# Patient Record
Sex: Female | Born: 1980 | Race: Black or African American | Hispanic: No | Marital: Married | Smoking: Never smoker
Health system: Southern US, Community
[De-identification: ages and names within clinical notes are randomized; demographics above are authoritative.]

## PROBLEM LIST (undated history)

## (undated) ENCOUNTER — Inpatient Hospital Stay (HOSPITAL_COMMUNITY): Payer: Self-pay

## (undated) DIAGNOSIS — O24419 Gestational diabetes mellitus in pregnancy, unspecified control: Secondary | ICD-10-CM

## (undated) DIAGNOSIS — Z789 Other specified health status: Secondary | ICD-10-CM

## (undated) DIAGNOSIS — K8020 Calculus of gallbladder without cholecystitis without obstruction: Secondary | ICD-10-CM

## (undated) HISTORY — DX: Gestational diabetes mellitus in pregnancy, unspecified control: O24.419

## (undated) HISTORY — PX: CHOLECYSTECTOMY: SHX55

## (undated) HISTORY — PX: NO PAST SURGERIES: SHX2092

## (undated) HISTORY — DX: Other specified health status: Z78.9

---

## 2009-03-20 ENCOUNTER — Inpatient Hospital Stay (HOSPITAL_COMMUNITY): Admission: AD | Admit: 2009-03-20 | Discharge: 2009-03-20 | Payer: Self-pay | Admitting: Obstetrics and Gynecology

## 2009-04-03 ENCOUNTER — Inpatient Hospital Stay (HOSPITAL_COMMUNITY): Admission: AD | Admit: 2009-04-03 | Discharge: 2009-04-05 | Payer: Self-pay | Admitting: Obstetrics and Gynecology

## 2009-06-19 ENCOUNTER — Encounter: Admission: RE | Admit: 2009-06-19 | Discharge: 2009-06-19 | Payer: Self-pay | Admitting: Internal Medicine

## 2009-08-23 ENCOUNTER — Emergency Department (HOSPITAL_BASED_OUTPATIENT_CLINIC_OR_DEPARTMENT_OTHER): Admission: EM | Admit: 2009-08-23 | Discharge: 2009-08-24 | Payer: Self-pay | Admitting: Emergency Medicine

## 2010-07-16 ENCOUNTER — Ambulatory Visit: Payer: Self-pay | Admitting: Interventional Radiology

## 2010-07-16 ENCOUNTER — Ambulatory Visit (HOSPITAL_BASED_OUTPATIENT_CLINIC_OR_DEPARTMENT_OTHER): Admission: RE | Admit: 2010-07-16 | Discharge: 2010-07-16 | Payer: Self-pay | Admitting: Obstetrics and Gynecology

## 2010-12-08 ENCOUNTER — Encounter: Payer: Self-pay | Admitting: Internal Medicine

## 2011-02-19 LAB — URINALYSIS, ROUTINE W REFLEX MICROSCOPIC
Bilirubin Urine: NEGATIVE
Glucose, UA: NEGATIVE mg/dL
Ketones, ur: 15 mg/dL — AB
pH: 6.5 (ref 5.0–8.0)

## 2011-02-19 LAB — DIFFERENTIAL
Basophils Absolute: 0.1 10*3/uL (ref 0.0–0.1)
Basophils Relative: 1 % (ref 0–1)
Eosinophils Absolute: 0 10*3/uL (ref 0.0–0.7)
Eosinophils Relative: 0 % (ref 0–5)
Lymphocytes Relative: 23 % (ref 12–46)
Lymphs Abs: 2.4 10*3/uL (ref 0.7–4.0)
Neutrophils Relative %: 73 % (ref 43–77)

## 2011-02-19 LAB — COMPREHENSIVE METABOLIC PANEL
ALT: 56 U/L — ABNORMAL HIGH (ref 0–35)
AST: 119 U/L — ABNORMAL HIGH (ref 0–37)
Albumin: 4 g/dL (ref 3.5–5.2)
Calcium: 8.9 mg/dL (ref 8.4–10.5)
Chloride: 105 mEq/L (ref 96–112)
Creatinine, Ser: 0.8 mg/dL (ref 0.4–1.2)
GFR calc Af Amer: >60 mL/min (ref >60)
GFR calc non Af Amer: >60 mL/min (ref >60)
Glucose, Bld: 138 mg/dL — ABNORMAL HIGH (ref 70–99)

## 2011-02-19 LAB — CBC
HCT: 34.9 % — ABNORMAL LOW (ref 36.0–46.0)
RBC: 4.41 MIL/uL (ref 3.87–5.11)
RDW: 13.1 % (ref 11.5–15.5)

## 2011-02-19 LAB — LIPASE, BLOOD: Lipase: 194 U/L (ref 23–300)

## 2011-02-24 LAB — CBC
HCT: 29.2 % — ABNORMAL LOW (ref 36.0–46.0)
Hemoglobin: 12.1 g/dL (ref 12.0–15.0)
MCHC: 33.6 g/dL (ref 30.0–36.0)
MCV: 77 fL — ABNORMAL LOW (ref 78.0–100.0)
Platelets: 230 10*3/uL (ref 150–400)
RBC: 4.68 MIL/uL (ref 3.87–5.11)
RDW: 17.1 % — ABNORMAL HIGH (ref 11.5–15.5)
RDW: 17.2 % — ABNORMAL HIGH (ref 11.5–15.5)
WBC: 12.3 10*3/uL — ABNORMAL HIGH (ref 4.0–10.5)
WBC: 9.5 10*3/uL (ref 4.0–10.5)

## 2011-07-17 ENCOUNTER — Other Ambulatory Visit (HOSPITAL_COMMUNITY)
Admission: RE | Admit: 2011-07-17 | Discharge: 2011-07-17 | Disposition: A | Discharge status: Home / Self Care | Payer: No Typology Code available for payment source | Source: Ambulatory Visit | Attending: Obstetrics and Gynecology | Admitting: Obstetrics and Gynecology

## 2011-07-17 DIAGNOSIS — Z1159 Encounter for screening for other viral diseases: Secondary | ICD-10-CM | POA: Insufficient documentation

## 2011-07-17 DIAGNOSIS — Z01419 Encounter for gynecological examination (general) (routine) without abnormal findings: Secondary | ICD-10-CM | POA: Insufficient documentation

## 2011-07-28 ENCOUNTER — Other Ambulatory Visit: Payer: Self-pay | Admitting: Otolaryngology

## 2011-07-28 ENCOUNTER — Other Ambulatory Visit (HOSPITAL_COMMUNITY)
Admission: RE | Admit: 2011-07-28 | Discharge: 2011-07-28 | Disposition: A | Discharge status: Home / Self Care | Payer: No Typology Code available for payment source | Source: Ambulatory Visit | Attending: Otolaryngology | Admitting: Otolaryngology

## 2011-07-28 DIAGNOSIS — E049 Nontoxic goiter, unspecified: Secondary | ICD-10-CM | POA: Insufficient documentation

## 2011-11-17 HISTORY — PX: WISDOM TOOTH EXTRACTION: SHX21

## 2012-02-27 ENCOUNTER — Emergency Department (HOSPITAL_COMMUNITY): Payer: No Typology Code available for payment source

## 2012-02-27 ENCOUNTER — Emergency Department (HOSPITAL_COMMUNITY)
Admission: EM | Admit: 2012-02-27 | Discharge: 2012-02-27 | Disposition: A | Discharge status: Home / Self Care | Payer: No Typology Code available for payment source | Attending: Emergency Medicine | Admitting: Emergency Medicine

## 2012-02-27 ENCOUNTER — Encounter (HOSPITAL_COMMUNITY): Payer: Self-pay | Admitting: *Deleted

## 2012-02-27 ENCOUNTER — Other Ambulatory Visit: Payer: Self-pay

## 2012-02-27 DIAGNOSIS — R10816 Epigastric abdominal tenderness: Secondary | ICD-10-CM | POA: Insufficient documentation

## 2012-02-27 DIAGNOSIS — R10811 Right upper quadrant abdominal tenderness: Secondary | ICD-10-CM | POA: Insufficient documentation

## 2012-02-27 DIAGNOSIS — K8020 Calculus of gallbladder without cholecystitis without obstruction: Secondary | ICD-10-CM

## 2012-02-27 DIAGNOSIS — R1013 Epigastric pain: Secondary | ICD-10-CM | POA: Insufficient documentation

## 2012-02-27 LAB — URINALYSIS, ROUTINE W REFLEX MICROSCOPIC
Leukocytes, UA: NEGATIVE
Nitrite: NEGATIVE
Protein, ur: NEGATIVE mg/dL

## 2012-02-27 LAB — HEPATIC FUNCTION PANEL
ALT: 14 U/L (ref 0–35)
AST: 27 U/L (ref 0–37)
Albumin: 3.9 g/dL (ref 3.5–5.2)
Alkaline Phosphatase: 49 U/L (ref 39–117)
Bilirubin, Direct: <0.1 mg/dL (ref 0.0–0.3)
Total Bilirubin: 0.2 mg/dL — ABNORMAL LOW (ref 0.3–1.2)
Total Protein: 7.5 g/dL (ref 6.0–8.3)

## 2012-02-27 LAB — LIPASE, BLOOD: Lipase: 41 U/L (ref 11–59)

## 2012-02-27 LAB — POCT PREGNANCY, URINE: Preg Test, Ur: NEGATIVE

## 2012-02-27 LAB — CBC
HCT: 37.1 % (ref 36.0–46.0)
MCH: 26.2 pg (ref 26.0–34.0)
MCV: 78.9 fL (ref 78.0–100.0)
WBC: 6.4 10*3/uL (ref 4.0–10.5)

## 2012-02-27 LAB — CARDIAC PANEL(CRET KIN+CKTOT+MB+TROPI)
Relative Index: INVALID (ref 0.0–2.5)
Troponin I: <0.3 ng/mL (ref <0.30)

## 2012-02-27 LAB — BASIC METABOLIC PANEL
Glucose, Bld: 104 mg/dL — ABNORMAL HIGH (ref 70–99)
Potassium: 4 mEq/L (ref 3.5–5.1)

## 2012-02-27 MED ORDER — POLYETHYLENE GLYCOL 3350 17 G PO PACK
17.0000 g | PACK | ORAL | Status: AC
  Administered 2012-02-27: 17 g via ORAL
  Filled 2012-02-27: qty 1

## 2012-02-27 MED ORDER — HYDROCODONE-ACETAMINOPHEN 5-325 MG PO TABS: 1.0000 | ORAL_TABLET | Freq: Four times a day (QID) | ORAL | Status: AC | PRN

## 2012-02-27 MED ORDER — POLYETHYLENE GLYCOL 3350 17 G PO PACK: 17.0000 g | PACK | Freq: Every day | ORAL | Status: AC

## 2012-02-27 MED ORDER — OXYCODONE-ACETAMINOPHEN 5-325 MG PO TABS
1.0000 | ORAL_TABLET | Freq: Once | ORAL | Status: AC
  Administered 2012-02-27: 1 via ORAL
  Filled 2012-02-27: qty 1

## 2012-02-27 NOTE — ED Notes (Signed)
Patient transported to CT 

## 2012-02-27 NOTE — Discharge Instructions (Signed)
Follow up with the general surgeon listed above for more studies on your gallbladder.  Reconstructions below to learn more about her diagnosis and reasons to return to the emergency department.  Use medications as prescribed.  Only use pain medication for severe pain and do not operate any machinery within 4 hours taking this medication.  Cholelithiasis Cholelithiasis (also called gallstones) is a form of gallbladder disease where gallstones form in your gallbladder. The gallbladder is a non-essential organ that stores bile made in the liver, which helps digest fats. Gallstones begin as small crystals and slowly grow into stones. Gallstone pain occurs when the gallbladder spasms, and a gallstone is blocking the duct. Pain can also occur when a stone passes out of the duct.  Women are more likely to develop gallstones than men. Other factors that increase the risk of gallbladder disease are:  Having multiple pregnancies. Physicians sometimes advise removing diseased gallbladders before future pregnancies.   Obesity.   Diets heavy in fried foods and fat.   Increasing age (older than 18).   Prolonged use of medications containing female hormones.   Diabetes mellitus.   Rapid weight loss.   Family history of gallstones (heredity).  SYMPTOMS  Feeling sick to your stomach (nauseous).   Abdominal pain.   Yellowing of the skin (jaundice).   Sudden pain. It may persist from several minutes to several hours.   Worsening pain with deep breathing or when jarred.   Fever.   Tenderness to the touch.  In some cases, when gallstones do not move into the bile duct, people have no pain or symptoms. These are called "silent" gallstones. TREATMENT In severe cases, emergency surgery may be required. HOME CARE INSTRUCTIONS   Only take over-the-counter or prescription medicines for pain, discomfort, or fever as directed by your caregiver.   Follow a low-fat diet until seen again. Fat causes the  gallbladder to contract, which can result in pain.   Follow up as instructed. Attacks are almost always recurrent and surgery is usually required for permanent treatment.  SEEK IMMEDIATE MEDICAL CARE IF:   Your pain increases and is not controlled by medications.   You have an oral temperature above 102 F (38.9 C), not controlled by medication.   You develop nausea and vomiting.  MAKE SURE YOU:   Understand these instructions.   Will watch your condition.   Will get help right away if you are not doing well or get worse.  Document Released: 10/29/2005 Document Revised: 10/22/2011 Document Reviewed: 01/01/2011 [facility] Patient Information 2012 [place], [place].Constipation in Adults Constipation is having fewer than 2 bowel movements per week. Usually, the stools are hard. As we grow older, constipation is more common. If you try to fix constipation with laxatives, the problem may get worse. This is because laxatives taken over a long period of time make the colon muscles weaker. A low-fiber diet, not taking in enough fluids, and taking some medicines may make these problems worse. MEDICATIONS THAT MAY CAUSE CONSTIPATION  Water pills (diuretics).   Calcium channel blockers (used to control blood pressure and for the heart).   Certain pain medicines (narcotics).   Anticholinergics.   Anti-inflammatory agents.   Antacids that contain aluminum.  DISEASES THAT CONTRIBUTE TO CONSTIPATION  Diabetes.   Parkinson's disease.   Dementia.   Stroke.   Depression.   Illnesses that cause problems with salt and water metabolism.  HOME CARE INSTRUCTIONS   Constipation is usually best cared for without medicines. Increasing dietary fiber and  eating more fruits and vegetables is the best way to manage constipation.   Slowly increase fiber intake to 25 to 38 grams per day. Whole grains, fruits, vegetables, and legumes are good sources of fiber. A dietitian can further help you  incorporate high-fiber foods into your diet.   Drink enough water and fluids to keep your urine clear or pale yellow.   A fiber supplement may be added to your diet if you cannot get enough fiber from foods.   Increasing your activities also helps improve regularity.   Suppositories, as suggested by your caregiver, will also help. If you are using antacids, such as aluminum or calcium containing products, it will be helpful to switch to products containing magnesium if your caregiver says it is okay.   If you have been given a liquid injection (enema) today, this is only a temporary measure. It should not be relied on for treatment of longstanding (chronic) constipation.   Stronger measures, such as magnesium sulfate, should be avoided if possible. This may cause uncontrollable diarrhea. Using magnesium sulfate may not allow you time to make it to the bathroom.  SEEK IMMEDIATE MEDICAL CARE IF:   There is bright red blood in the stool.   The constipation stays for more than 4 days.   There is belly (abdominal) or rectal pain.   You do not seem to be getting better.   You have any questions or concerns.  MAKE SURE YOU:   Understand these instructions.   Will watch your condition.   Will get help right away if you are not doing well or get worse.  Document Released: 07/31/2004 Document Revised: 10/22/2011 Document Reviewed: 10/06/2011 [facility] Patient Information 2012 [place], [place].

## 2012-02-27 NOTE — ED Provider Notes (Signed)
History     CSN: [id number]  Arrival date & time 02/27/12  8295   First MD Initiated Contact with Patient 02/27/12 [phone]      Chief Complaint  Patient presents with  . Abdominal Pain    epigastric pain with pain in back and left shoulder    (Consider location/radiation/quality/duration/timing/severity/associated sxs/prior treatment) HPI Comments: Patient with no significant past medical history presents emergency Department with a chief complaint of abdominal pain.  It is located in the epigastric right upper quadrant, onset began at 2 a.m. this morning, was acute in nature and has gradually worsened.  Pain is described as a pressure or and patient states she is unable to burp.  The pain radiates across the diaphragm and to her right shoulder.  Patients last menstrual period was March 24, she has no history of abdominal surgery.  In addition patient states that she has been constipated greater than one week.  She took an enema 3 days ago which helped her or have a bowel movement yesterday and again this morning however both were extremely strange.  Movements make the abdominal pain worse.  Patient denies nausea, vomiting, fevers, night sweats, chills, chest pain, shortness of breath.  The patient is unaware if food makes the symptoms worse.  No other complaints at this time.  Patient is a 31 y.o. female presenting with abdominal pain. The history is provided by the patient.  Abdominal Pain The primary symptoms of the illness include abdominal pain. The primary symptoms of the illness do not include fever, shortness of breath, nausea, vomiting, diarrhea or dysuria.  Symptoms associated with the illness do not include chills, constipation, urgency or hematuria.    History reviewed. No pertinent past medical history.  History reviewed. No pertinent past surgical history.  No family history on file.  History  Substance Use Topics  . Smoking status: Not on file  . Smokeless tobacco: Not on  file  . Alcohol Use: No    OB History    Grav Para Term Preterm Abortions TAB SAB Ect Mult Living                  Review of Systems  Constitutional: Positive for appetite change. Negative for fever and chills.  HENT: Negative for neck stiffness and dental problem.   Eyes: Negative for visual disturbance.  Respiratory: Negative for cough, chest tightness, shortness of breath and wheezing.   Cardiovascular: Negative for chest pain.  Gastrointestinal: Positive for abdominal pain. Negative for nausea, vomiting, diarrhea, constipation, blood in stool, abdominal distention, anal bleeding and rectal pain.  Genitourinary: Negative for dysuria, urgency, hematuria and flank pain.  Musculoskeletal: Negative for myalgias and arthralgias.  Skin: Negative for rash.  Neurological: Negative for dizziness, syncope, speech difficulty, numbness and headaches.  Hematological: Does not bruise/bleed easily.  All other systems reviewed and are negative.    Allergies  Review of patient's allergies indicates no known allergies.  Home Medications   Current Outpatient Rx  Name Route Sig Dispense Refill  . IBUPROFEN 200 MG PO TABS Oral Take 400 mg by mouth every 6 (six) hours as needed. For pain      BP 113/64  Temp(Src) 98.3 F (36.8 C) (Oral)  Resp 20  SpO2 100%  LMP 02/07/2012  Physical Exam  Nursing note and vitals reviewed. Constitutional: Vital signs are normal. She appears well-developed and well-nourished. No distress.  HENT:  Head: Normocephalic and atraumatic.  Mouth/Throat: Uvula is midline, oropharynx is clear and moist and  mucous membranes are normal.  Eyes: Conjunctivae and EOM are normal. Pupils are equal, round, and reactive to light.  Neck: Normal range of motion and full passive range of motion without pain. Neck supple. No spinous process tenderness and no muscular tenderness present. No rigidity. No Brudzinski's sign noted.  Cardiovascular: Normal rate and regular rhythm.    Pulmonary/Chest: Effort normal and breath sounds normal. No accessory muscle usage. Not tachypneic. No respiratory distress.  Abdominal: Soft. Normal appearance. She exhibits no distension, no ascites, no pulsatile midline mass and no mass. There is tenderness in the right upper quadrant and epigastric area. There is no CVA tenderness. No hernia.    Lymphadenopathy:    She has no cervical adenopathy.  Neurological: She is alert.  Skin: Skin is warm and dry. No rash noted. She is not diaphoretic.  Psychiatric: She has a normal mood and affect. Her speech is normal and behavior is normal.    ED Course  Procedures (including critical care time)  Labs Reviewed  BASIC METABOLIC PANEL - Abnormal; Notable for the following:    Glucose, Bld 104 (*)    All other components within normal limits  HEPATIC FUNCTION PANEL - Abnormal; Notable for the following:    Total Bilirubin 0.2 (*)    All other components within normal limits  CARDIAC PANEL(CRET KIN+CKTOT+MB+TROPI)  CBC  URINALYSIS, ROUTINE W REFLEX MICROSCOPIC  LIPASE, BLOOD  POCT PREGNANCY, URINE   Dg Abd 1 View  02/27/2012  *RADIOLOGY REPORT*  Clinical Data: Constipation and abdominal pain.  ABDOMEN - 1 VIEW  Comparison: None.  Findings: Single view of the abdomen was obtained.  There is a large amount of stool throughout the abdomen.  There is gas within the colon.  No significant small bowel gaseous distention.  Limited evaluation for renal calculi due to the large stool burden.  No gross bony abnormality.  IMPRESSION: Large amount of stool throughout the abdomen.  Findings are compatible with history of constipation.  Original Report Authenticated By: Richarda Overlie, M.D.   US Abdomen Complete  02/27/2012  *RADIOLOGY REPORT*  Clinical Data:  Abdominal pain.  COMPLETE ABDOMINAL ULTRASOUND  Comparison:  None.  Findings:  Gallbladder:  Numerous tiny layering gallstones are observed, causing shadowing.  Gallbladder wall thickness is within  normal limits.  No pericholecystic fluid.  Sonographic Murphy's sign is absent.  Common bile duct:  Dilated, measuring 7 mm in diameter, without directly visualized choledocholithiasis.  Liver:  Mild hepatic steatosis.  No intrahepatic biliary dilatation is observed.  No focal lesion noted.  IVC:  Appears normal.  Pancreas:  Not well seen due to overlying bowel gas.  Spleen:  Measures 10 cm craniocaudad, at the upper limits of normal in size.  Right Kidney:  Measures 10.4 cm in length and appears normal.  Left Kidney:  Measures 0.5 cm in length and appears normal.  Abdominal aorta:  No aneurysm identified.  IMPRESSION:  1.  Cholelithiasis, with numerous tiny gallstones observed. Sonographic Murphy's sign absent. 2.  Dilated common bile duct.  However, there was no directly demonstrated choledocholithiasis.  No intrahepatic bile duct dilatation. 3.  Mild fatty infiltration of the liver 4.  Poor visualization of pancreas due to overlying bowel gas.  Original Report Authenticated By: Dellia Cloud, M.D.     No diagnosis found.   Date: 02/27/2012  Rate: 79  Rhythm: normal sinus rhythm  QRS Axis: normal  Intervals: normal  ST/T Wave abnormalities: normal  Conduction Disutrbances: none  Narrative Interpretation:  Old EKG Reviewed: No old ECG    MDM  Cholelithiasis & constipation  Labs and radiology reviewed. Pt w VSS, no white count, and currently asymptomatic. Pt advised to follow up with general surgery for further studies. Pain managed in the ED and pt given miralax. No complaints at this time and VSS prior to dc. Pt advised to avoid fatty foods and to add fiber to diet. Verbalizes understanding.         Jaci Carrel, PA-C 02/27/12 0834  Jaci Carrel, PA-C 02/27/12 [phone]

## 2012-02-27 NOTE — ED Provider Notes (Signed)
Medical screening examination/treatment/procedure(s) were performed by non-physician practitioner and as supervising physician I was immediately available for consultation/collaboration.  Olivia Mackie, MD 02/27/12 2136

## 2012-02-27 NOTE — ED Notes (Signed)
Pt woke up with epigastric pain radiating to her left shoulder and her back.  Pt has some sob with this.  No nausea or diaphoresis.

## 2012-03-23 ENCOUNTER — Ambulatory Visit (INDEPENDENT_AMBULATORY_CARE_PROVIDER_SITE_OTHER): Payer: No Typology Code available for payment source | Admitting: Surgery

## 2012-03-23 ENCOUNTER — Encounter (INDEPENDENT_AMBULATORY_CARE_PROVIDER_SITE_OTHER): Payer: Self-pay | Admitting: Surgery

## 2012-03-23 VITALS — BP 104/60 | HR 85 | Temp 98.6°F | Ht 66.0 in | Wt 149.0 lb

## 2012-03-23 DIAGNOSIS — K8020 Calculus of gallbladder without cholecystitis without obstruction: Secondary | ICD-10-CM

## 2012-03-23 DIAGNOSIS — K5900 Constipation, unspecified: Secondary | ICD-10-CM | POA: Insufficient documentation

## 2012-03-23 NOTE — Patient Instructions (Signed)
See the Handout(s) we gave you.  Consider surgery.  Please call our office at [phone] if you wish to schedule surgery or if you have further questions / concerns.    GETTING TO GOOD BOWEL HEALTH. Irregular bowel habits such as constipation and diarrhea can lead to many problems over time.  Having one soft bowel movement a day is the most important way to prevent further problems.  The anorectal canal is designed to handle stretching and feces to safely manage our ability to get rid of solid waste (feces, poop, stool) out of our body.  BUT, hard constipated stools can act like ripping concrete bricks and diarrhea can be a burning fire to this very sensitive area of our body, causing inflamed hemorrhoids, anal fissures, increasing risk is perirectal abscesses, abdominal pain/bloating, an making irritable bowel worse.     The goal: ONE SOFT BOWEL MOVEMENT A DAY!  To have soft, regular bowel movements:    Drink at least 8 tall glasses of water a day.     Take plenty of fiber.  Fiber is the undigested part of plant food that passes into the colon, acting s "natures broom" to encourage bowel motility and movement.  Fiber can absorb and hold large amounts of water. This results in a larger, bulkier stool, which is soft and easier to pass. Work gradually over several weeks up to 6 servings a day of fiber (25g a day even more if needed) in the form of: o Vegetables -- Root (potatoes, carrots, turnips), leafy green (lettuce, salad greens, celery, spinach), or cooked high residue (cabbage, broccoli, etc) o Fruit -- Fresh (unpeeled skin & pulp), Dried (prunes, apricots, cherries, etc ),  or stewed ( applesauce)  o Whole grain breads, pasta, etc (whole wheat)  o Bran cereals    Bulking Agents -- This type of water-retaining fiber generally is easily obtained each day by one of the following:  o Psyllium bran -- The psyllium plant is remarkable because its ground seeds can retain so much water. This product  is available as Metamucil, Konsyl, Effersyllium, Per Diem Fiber, or the less expensive generic preparation in drug and health food stores. Although labeled a laxative, it really is not a laxative.  o Methylcellulose -- This is another fiber derived from wood which also retains water. It is available as Citrucel. o Polyethylene Glycol - and "artificial" fiber commonly called Miralax or Glycolax.  It is helpful for people with gassy or bloated feelings with regular fiber o Flax Seed - a less gassy fiber than psyllium   No reading or other relaxing activity while on the toilet. If bowel movements take longer than 5 minutes, you are too constipated   AVOID CONSTIPATION.  High fiber and water intake usually takes care of this.  Sometimes a laxative is needed to stimulate more frequent bowel movements, but    Laxatives are not a good long-term solution as it can wear the colon out. o Osmotics (Milk of Magnesia, Fleets phosphosoda, Magnesium citrate, MiraLax, GoLytely) are safer than  o Stimulants (Senokot, Castor Oil, Dulcolax, Ex Lax)    o Do not take laxatives for more than 7days in a row.    IF SEVERELY CONSTIPATED, try a Bowel Retraining Program: o Do not use laxatives.  o Eat a diet high in roughage, such as bran cereals and leafy vegetables.  o Drink six (6) ounces of prune or apricot juice each morning.  o Eat two (2) large servings of stewed fruit  each day.  o Take one (1) heaping tablespoon of a psyllium-based bulking agent twice a day. Use sugar-free sweetener when possible to avoid excessive calories.  o Eat a normal breakfast.  o Set aside 15 minutes after breakfast to sit on the toilet, but do not strain to have a bowel movement.  o If you do not have a bowel movement by the third day, use an enema and repeat the above steps.    Controlling diarrhea o Switch to liquids and simpler foods for a few days to avoid stressing your intestines further. o Avoid dairy products (especially milk &  ice cream) for a short time.  The intestines often can lose the ability to digest lactose when stressed. o Avoid foods that cause gassiness or bloating.  Typical foods include beans and other legumes, cabbage, broccoli, and dairy foods.  Every person has some sensitivity to other foods, so listen to our body and avoid those foods that trigger problems for you. o Adding fiber (Citrucel, Metamucil, psyllium, Miralax) gradually can help thicken stools by absorbing excess fluid and retrain the intestines to act more normally.  Slowly increase the dose over a few weeks.  Too much fiber too soon can backfire and cause cramping & bloating. o Probiotics (such as active yogurt, Align, etc) may help repopulate the intestines and colon with normal bacteria and calm down a sensitive digestive tract.  Most studies show it to be of mild help, though, and such products can be costly. o Medicines:   Bismuth subsalicylate (ex. Kayopectate, Pepto Bismol) every 30 minutes for up to 6 doses can help control diarrhea.  Avoid if pregnant.   Loperamide (Immodium) can slow down diarrhea.  Start with two tablets (4mg  total) first and then try one tablet every 6 hours.  Avoid if you are having fevers or severe pain.  If you are not better or start feeling worse, stop all medicines and call your doctor for advice o Call your doctor if you are getting worse or not better.  Sometimes further testing (cultures, endoscopy, X-ray studies, bloodwork, etc) may be needed to help diagnose and treat the cause of the diarrhea. o

## 2012-03-23 NOTE — Progress Notes (Signed)
Subjective:     Patient ID: Suzanne Ramirez, female   DOB: Mar 20, 1981, 31 y.o.   MRN: [id number]  HPI  Suzanne Ramirez  07/19/1981 [id number]  Patient Care Team: Ron Parker, MD as PCP - General (Internal Medicine)  This patient is a 31 y.o.female who presents today for surgical evaluation at the request of Lisette Paz & Marisa Severin, [facility] ED.    Reason for evaluation: Abdominal pain and gallstones.  Patient is a pleasant young female from [place]. Pleas Koch are often. Has had episodes of epigastric pain. Radiating to her back and shoulders. Positive nausea and bloating. Attacks have been usually after a fatty meal. She had a more severe attack and went to the emergency room. Evaluation was concerning for biliary colic with gallstones. Differential diagnosis seemed less likely. She sent to me for surgical evaluation.  Patient notes she has some mild heartburn but usually controlled with diet. Does not take medicines. No prior bowel surgery. Can walk 20 minutes before she starts feeling a little short of breath and tired. No exertional chest pain. Nonsmoker. Has bowel movements one to 2 times a week. Primary laxative a few times but it made her feel crampy. She comes today with her husband.  Patient Active Problem List  Diagnoses  . Cholecystolithiasis  . Constipation    No past medical history on file.  No past surgical history on file.  History   Social History  . Marital Status: Married    Spouse Name: N/A    Number of Children: N/A  . Years of Education: N/A   Occupational History  . Not on file.   Social History Main Topics  . Smoking status: Never Smoker   . Smokeless tobacco: Not on file  . Alcohol Use: No  . Drug Use: No  . Sexually Active:    Other Topics Concern  . Not on file   Social History Narrative  . No narrative on file    No family history on file.  Current Outpatient Prescriptions  Medication Sig Dispense Refill  . oxyCODONE-acetaminophen  (TYLOX) 5-500 MG per capsule Take 1 capsule by mouth every 4 (four) hours as needed.         No Known Allergies  BP 104/60  Pulse 85  Temp(Src) 98.6 F (37 C) (Temporal)  Ht 5\' 6"  (1.676 m)  Wt 149 lb (67.586 kg)  BMI 24.05 kg/m2  SpO2 98%  LMP 02/07/2012     Review of Systems  Constitutional: Negative for fever, chills, diaphoresis, appetite change and fatigue.  HENT: Negative for ear pain, sore throat, trouble swallowing, neck pain and ear discharge.   Eyes: Negative for photophobia, discharge and visual disturbance.  Respiratory: Negative for cough, choking, chest tightness and shortness of breath.   Cardiovascular: Negative for chest pain and palpitations.       Patient walks 20 minutes for about .5  miles without difficulty.  No exertional chest/neck/shoulder/arm pain.  Gastrointestinal: Positive for nausea, abdominal pain and constipation. Negative for vomiting, diarrhea, anal bleeding and rectal pain.       No personal nor family history of GI/colon cancer, inflammatory bowel disease, irritable bowel syndrome, allergy such as Celiac Sprue, dietary/dairy problems, colitis, ulcers nor gastritis.    No recent sick contacts/gastroenteritis.  No travel outside the country.  No changes in diet.    Genitourinary: Negative for dysuria, frequency and difficulty urinating.  Musculoskeletal: Negative for myalgias and gait problem.  Skin: Negative for color change, pallor and rash.  Neurological: Positive for headaches. Negative for dizziness, speech difficulty, weakness and numbness.  Hematological: Negative for adenopathy.  Psychiatric/Behavioral: Negative for confusion and agitation. The patient is not nervous/anxious.        Objective:   Physical Exam  Constitutional: She is oriented to person, place, and time. She appears well-developed and well-nourished. No distress.  HENT:  Head: Normocephalic.  Mouth/Throat: Oropharynx is clear and moist. No oropharyngeal exudate.    Eyes: Conjunctivae and EOM are normal. Pupils are equal, round, and reactive to light. No scleral icterus.  Neck: Normal range of motion. Neck supple. No tracheal deviation present.  Cardiovascular: Normal rate, regular rhythm and intact distal pulses.   Pulmonary/Chest: Effort normal and breath sounds normal. No respiratory distress. She exhibits no tenderness.  Abdominal: Soft. She exhibits no distension and no mass. Hernia confirmed negative in the right inguinal area and confirmed negative in the left inguinal area.    Genitourinary: No vaginal discharge found.  Musculoskeletal: Normal range of motion. She exhibits no tenderness.  Lymphadenopathy:    She has no cervical adenopathy.       Right: No inguinal adenopathy present.       Left: No inguinal adenopathy present.  Neurological: She is alert and oriented to person, place, and time. No cranial nerve deficit. She exhibits normal muscle tone. Coordination normal.  Skin: Skin is warm and dry. No rash noted. She is not diaphoretic. No erythema.  Psychiatric: She has a normal mood and affect. Her behavior is normal. Judgment and thought content normal.   Dg Abd 1 View  02/27/2012  *RADIOLOGY REPORT*  Clinical Data: Constipation and abdominal pain.  ABDOMEN - 1 VIEW  Comparison: None.  Findings: Single view of the abdomen was obtained.  There is a large amount of stool throughout the abdomen.  There is gas within the colon.  No significant small bowel gaseous distention.  Limited evaluation for renal calculi due to the large stool burden.  No Decarla Siemen bony abnormality.  IMPRESSION: Large amount of stool throughout the abdomen.  Findings are compatible with history of constipation.  Original Report Authenticated By: Richarda Overlie, M.D.   US Abdomen Complete  02/27/2012  *RADIOLOGY REPORT*  Clinical Data:  Abdominal pain.  COMPLETE ABDOMINAL ULTRASOUND  Comparison:  None.  Findings:  Gallbladder:  Numerous tiny layering gallstones are observed,  causing shadowing.  Gallbladder wall thickness is within normal limits.  No pericholecystic fluid.  Sonographic Murphy's sign is absent.  Common bile duct:  Dilated, measuring 7 mm in diameter, without directly visualized choledocholithiasis.  Liver:  Mild hepatic steatosis.  No intrahepatic biliary dilatation is observed.  No focal lesion noted.  IVC:  Appears normal.  Pancreas:  Not well seen due to overlying bowel gas.  Spleen:  Measures 10 cm craniocaudad, at the upper limits of normal in size.  Right Kidney:  Measures 10.4 cm in length and appears normal.  Left Kidney:  Measures 0.5 cm in length and appears normal.  Abdominal aorta:  No aneurysm identified.  IMPRESSION:  1.  Cholelithiasis, with numerous tiny gallstones observed. Sonographic Murphy's sign absent. 2.  Dilated common bile duct.  However, there was no directly demonstrated choledocholithiasis.  No intrahepatic bile duct dilatation. 3.  Mild fatty infiltration of the liver 4.  Poor visualization of pancreas due to overlying bowel gas.  Original Report Authenticated By: Dellia Cloud, M.D.      Assessment:     Biliary colic, prob cholecystitis  Chronic constipation  Plan:     She has a classic story for biliary colic. She has some soreness suspicious for chronic cholecystitis as well. I think she needs surgery. She was initially hesitant to do this but then seemed more convinced. I think she is a reasonable candidate for a single site technique. She has plans to travel to [place] in early June. She was hoping to get this done beforehand but wonders if the best way to after July. I cautioned her waiting and a few more months could be risky since she is having more frequent and more intense attacks including an ER visit. She had a dilated common bile duct but LFTs are normal. I do think a cholangiogram was warranted. She is leaning towards trying to get it done sooner. She will call me and let me know. I discussed the procedure in  detail:  The anatomy & physiology of hepatobiliary & pancreatic function was discussed.  The pathophysiology of gallbladder dysfunction was discussed.  Natural history risks without surgery was discussed.   I feel the risks of no intervention will lead to serious problems that outweigh the operative risks; therefore, I recommended cholecystectomy to remove the pathology.  I explained laparoscopic techniques with possible need for an open approach.  Probable cholangiogram to evaluate the bilary tract was explained as well.    Risks such as bleeding, infection, abscess, leak, injury to other organs, need for further treatment, heart attack, death, and other risks were discussed.  I noted a good likelihood this will help address the problem.  Possibility that this will not correct all abdominal symptoms was explained.  Goals of post-operative recovery were discussed as well.  We will work to minimize complications.  An educational handout further explaining the pathology and treatment options was given as well.  Questions were answered.  The patient expresses understanding & wishes to proceed with surgery.  The patient also needs a bowel regimen for her constipation. I discussed this in detail and gave her information on that as well. If it does not improve, consider endoscopy.

## 2012-04-07 ENCOUNTER — Telehealth (INDEPENDENT_AMBULATORY_CARE_PROVIDER_SITE_OTHER): Payer: Self-pay

## 2012-04-07 NOTE — Telephone Encounter (Signed)
Pt calling to notify Dr Michaell Cowing she just found out she was [redacted]wks pregnant. The pt is scheduled for lap chole single site on 7/26. The pt wanted to know if this would change her surgery plans. I told the pt that I would notify Dr Michaell Cowing and get back in touch with the pt.

## 2012-04-13 ENCOUNTER — Telehealth (INDEPENDENT_AMBULATORY_CARE_PROVIDER_SITE_OTHER): Payer: Self-pay

## 2012-04-13 NOTE — Telephone Encounter (Signed)
No cholecystectomy until she completes her pregnancy.  If she has intractable nausea and vomiting or unbearable pain, she may be a candidate for cholecystectomy with a classic 4 port technique in her second trimester. I would do this only if severe and okayed by her obstetrician

## 2012-04-13 NOTE — Telephone Encounter (Signed)
The pt is calling back from her message that was left last week that she just found out she is pregnant. The pt is scheduled for lap chole single site on 7/26 does she need to cancel the surgery or will you operate on her. Please advise.

## 2012-04-13 NOTE — Telephone Encounter (Signed)
Called pt back to notify her that Dr Michaell Cowing wants to cancel her surgery until after she completes her pregnancy. The pt will call us back after she delivers the baby to reschedule the surgery.

## 2012-07-05 LAB — OB RESULTS CONSOLE ABO/RH: RH Type: POSITIVE

## 2012-07-05 LAB — OB RESULTS CONSOLE ANTIBODY SCREEN: Antibody Screen: NEGATIVE

## 2012-07-05 LAB — OB RESULTS CONSOLE RPR: RPR: NONREACTIVE

## 2012-07-05 LAB — OB RESULTS CONSOLE HEPATITIS B SURFACE ANTIGEN: Hepatitis B Surface Ag: NEGATIVE

## 2012-11-16 NOTE — L&D Delivery Note (Signed)
Delivery Note Pt progressed to complete and pushed well.  At 1:18 PM a viable female was delivered via Vaginal, Spontaneous Delivery (Presentation: Left Occiput Anterior).  APGAR: 8, 9; weight 7 lb 15 oz (3600 g).   Placenta status: Intact, Spontaneous.  Cord: 3 vessels with the following complications: None.  Anesthesia: Epidural  Episiotomy: None Lacerations: 1st degree Suture Repair: 3.0 vicryl rapide Est. Blood Loss (mL): 350  She has a healed small hole in her labia minora just inferior and to the left of her clitoris, most likely from a previous OB laceration. Parents desire circumcision, the procedure and risks were discussed, consent signed.    Mom to postpartum.  Baby to stay with mom.  Hilding Quintanar D 12/06/2012, 1:34 PM

## 2012-11-17 LAB — OB RESULTS CONSOLE GBS: GBS: POSITIVE

## 2012-12-01 ENCOUNTER — Encounter (HOSPITAL_COMMUNITY): Payer: Self-pay | Admitting: *Deleted

## 2012-12-01 ENCOUNTER — Telehealth (HOSPITAL_COMMUNITY): Payer: Self-pay | Admitting: *Deleted

## 2012-12-01 NOTE — Telephone Encounter (Signed)
Preadmission screen  

## 2012-12-06 ENCOUNTER — Encounter (HOSPITAL_COMMUNITY): Payer: Self-pay | Admitting: Anesthesiology

## 2012-12-06 ENCOUNTER — Encounter (HOSPITAL_COMMUNITY): Payer: Self-pay

## 2012-12-06 ENCOUNTER — Inpatient Hospital Stay (HOSPITAL_COMMUNITY)
Admission: RE | Admit: 2012-12-06 | Discharge: 2012-12-07 | DRG: 775 | Disposition: A | Discharge status: Home / Self Care | Payer: Medicaid Other | Source: Ambulatory Visit | Attending: Obstetrics and Gynecology | Admitting: Obstetrics and Gynecology

## 2012-12-06 ENCOUNTER — Inpatient Hospital Stay (HOSPITAL_COMMUNITY): Payer: Medicaid Other | Admitting: Anesthesiology

## 2012-12-06 VITALS — BP 102/50 | HR 85 | Temp 97.8°F | Resp 16 | Ht 64.0 in | Wt 145.5 lb

## 2012-12-06 DIAGNOSIS — O99892 Other specified diseases and conditions complicating childbirth: Secondary | ICD-10-CM | POA: Diagnosis present

## 2012-12-06 DIAGNOSIS — Z22330 Carrier of Group B streptococcus: Secondary | ICD-10-CM

## 2012-12-06 DIAGNOSIS — Z3490 Encounter for supervision of normal pregnancy, unspecified, unspecified trimester: Secondary | ICD-10-CM

## 2012-12-06 LAB — CBC
MCH: 23.7 pg — ABNORMAL LOW (ref 26.0–34.0)
MCV: 72.9 fL — ABNORMAL LOW (ref 78.0–100.0)
Platelets: 249 10*3/uL (ref 150–400)
RBC: 4.73 MIL/uL (ref 3.87–5.11)

## 2012-12-06 MED ORDER — METHYLERGONOVINE MALEATE 0.2 MG PO TABS: 0.2000 mg | ORAL_TABLET | ORAL | Status: DC | PRN

## 2012-12-06 MED ORDER — IBUPROFEN 600 MG PO TABS: 600.0000 mg | ORAL_TABLET | Freq: Four times a day (QID) | ORAL | Status: DC | PRN

## 2012-12-06 MED ORDER — PHENYLEPHRINE 40 MCG/ML (10ML) SYRINGE FOR IV PUSH (FOR BLOOD PRESSURE SUPPORT)
80.0000 ug | PREFILLED_SYRINGE | INTRAVENOUS | Status: DC | PRN
  Filled 2012-12-06: qty 5

## 2012-12-06 MED ORDER — FENTANYL 2.5 MCG/ML BUPIVACAINE 1/10 % EPIDURAL INFUSION (WH - ANES)
INTRAMUSCULAR | Status: DC | PRN
  Administered 2012-12-06: 14 mL/h via EPIDURAL

## 2012-12-06 MED ORDER — EPHEDRINE 5 MG/ML INJ
10.0000 mg | INTRAVENOUS | Status: DC | PRN
  Filled 2012-12-06: qty 4

## 2012-12-06 MED ORDER — OXYTOCIN 40 UNITS IN LACTATED RINGERS INFUSION - SIMPLE MED: 62.5000 mL/h | INTRAVENOUS | Status: DC

## 2012-12-06 MED ORDER — IBUPROFEN 600 MG PO TABS
600.0000 mg | ORAL_TABLET | Freq: Four times a day (QID) | ORAL | Status: DC
  Administered 2012-12-06 – 2012-12-07 (×4): 600 mg via ORAL
  Filled 2012-12-06 (×4): qty 1

## 2012-12-06 MED ORDER — PENICILLIN G POTASSIUM 5000000 UNITS IJ SOLR
2.5000 10*6.[IU] | INTRAVENOUS | Status: DC
  Administered 2012-12-06: 2.5 10*6.[IU] via INTRAVENOUS
  Filled 2012-12-06 (×5): qty 2.5

## 2012-12-06 MED ORDER — ACETAMINOPHEN 325 MG PO TABS: 650.0000 mg | ORAL_TABLET | ORAL | Status: DC | PRN

## 2012-12-06 MED ORDER — OXYTOCIN 40 UNITS IN LACTATED RINGERS INFUSION - SIMPLE MED
1.0000 m[IU]/min | INTRAVENOUS | Status: DC
  Administered 2012-12-06: 2 m[IU]/min via INTRAVENOUS
  Filled 2012-12-06: qty 1000

## 2012-12-06 MED ORDER — OXYCODONE-ACETAMINOPHEN 5-325 MG PO TABS
1.0000 | ORAL_TABLET | ORAL | Status: DC | PRN
  Administered 2012-12-06 – 2012-12-07 (×2): 1 via ORAL
  Filled 2012-12-06 (×2): qty 1

## 2012-12-06 MED ORDER — DIPHENHYDRAMINE HCL 50 MG/ML IJ SOLN: 12.5000 mg | INTRAMUSCULAR | Status: DC | PRN

## 2012-12-06 MED ORDER — PRENATAL MULTIVITAMIN CH
1.0000 | ORAL_TABLET | Freq: Every day | ORAL | Status: DC
  Administered 2012-12-07: 1 via ORAL
  Filled 2012-12-06: qty 1

## 2012-12-06 MED ORDER — CITRIC ACID-SODIUM CITRATE 334-500 MG/5ML PO SOLN: 30.0000 mL | ORAL | Status: DC | PRN

## 2012-12-06 MED ORDER — EPHEDRINE 5 MG/ML INJ: 10.0000 mg | INTRAVENOUS | Status: DC | PRN

## 2012-12-06 MED ORDER — DIBUCAINE 1 % RE OINT: 1.0000 "application " | TOPICAL_OINTMENT | RECTAL | Status: DC | PRN

## 2012-12-06 MED ORDER — PHENYLEPHRINE 40 MCG/ML (10ML) SYRINGE FOR IV PUSH (FOR BLOOD PRESSURE SUPPORT): 80.0000 ug | PREFILLED_SYRINGE | INTRAVENOUS | Status: DC | PRN

## 2012-12-06 MED ORDER — LACTATED RINGERS IV SOLN: 500.0000 mL | Freq: Once | INTRAVENOUS | Status: DC

## 2012-12-06 MED ORDER — WITCH HAZEL-GLYCERIN EX PADS: 1.0000 "application " | MEDICATED_PAD | CUTANEOUS | Status: DC | PRN

## 2012-12-06 MED ORDER — OXYCODONE-ACETAMINOPHEN 5-325 MG PO TABS: 1.0000 | ORAL_TABLET | ORAL | Status: DC | PRN

## 2012-12-06 MED ORDER — MEASLES, MUMPS & RUBELLA VAC ~~LOC~~ INJ: 0.5000 mL | INJECTION | Freq: Once | SUBCUTANEOUS | Status: DC

## 2012-12-06 MED ORDER — TERBUTALINE SULFATE 1 MG/ML IJ SOLN: 0.2500 mg | Freq: Once | INTRAMUSCULAR | Status: DC | PRN

## 2012-12-06 MED ORDER — LIDOCAINE HCL (PF) 1 % IJ SOLN
30.0000 mL | INTRAMUSCULAR | Status: DC | PRN
  Filled 2012-12-06: qty 30

## 2012-12-06 MED ORDER — LACTATED RINGERS IV SOLN
INTRAVENOUS | Status: DC
  Administered 2012-12-06: 125 mL/h via INTRAVENOUS
  Administered 2012-12-06: 07:00:00 via INTRAVENOUS
  Administered 2012-12-06: 125 mL/h via INTRAVENOUS

## 2012-12-06 MED ORDER — SIMETHICONE 80 MG PO CHEW: 80.0000 mg | CHEWABLE_TABLET | ORAL | Status: DC | PRN

## 2012-12-06 MED ORDER — DIPHENHYDRAMINE HCL 25 MG PO CAPS: 25.0000 mg | ORAL_CAPSULE | Freq: Four times a day (QID) | ORAL | Status: DC | PRN

## 2012-12-06 MED ORDER — FENTANYL 2.5 MCG/ML BUPIVACAINE 1/10 % EPIDURAL INFUSION (WH - ANES)
14.0000 mL/h | INTRAMUSCULAR | Status: DC
  Filled 2012-12-06: qty 125

## 2012-12-06 MED ORDER — MAGNESIUM HYDROXIDE 400 MG/5ML PO SUSP: 30.0000 mL | ORAL | Status: DC | PRN

## 2012-12-06 MED ORDER — OXYTOCIN BOLUS FROM INFUSION: 500.0000 mL | INTRAVENOUS | Status: DC

## 2012-12-06 MED ORDER — TETANUS-DIPHTH-ACELL PERTUSSIS 5-2.5-18.5 LF-MCG/0.5 IM SUSP: 0.5000 mL | Freq: Once | INTRAMUSCULAR | Status: DC

## 2012-12-06 MED ORDER — ONDANSETRON HCL 4 MG PO TABS: 4.0000 mg | ORAL_TABLET | ORAL | Status: DC | PRN

## 2012-12-06 MED ORDER — LIDOCAINE HCL (PF) 1 % IJ SOLN
INTRAMUSCULAR | Status: DC | PRN
  Administered 2012-12-06: 8 mL
  Administered 2012-12-06: 9 mL

## 2012-12-06 MED ORDER — SENNOSIDES-DOCUSATE SODIUM 8.6-50 MG PO TABS
2.0000 | ORAL_TABLET | Freq: Every day | ORAL | Status: DC
  Administered 2012-12-06: 2 via ORAL

## 2012-12-06 MED ORDER — ZOLPIDEM TARTRATE 5 MG PO TABS: 5.0000 mg | ORAL_TABLET | Freq: Every evening | ORAL | Status: DC | PRN

## 2012-12-06 MED ORDER — LACTATED RINGERS IV SOLN: 500.0000 mL | INTRAVENOUS | Status: DC | PRN

## 2012-12-06 MED ORDER — LANOLIN HYDROUS EX OINT: TOPICAL_OINTMENT | CUTANEOUS | Status: DC | PRN

## 2012-12-06 MED ORDER — PENICILLIN G POTASSIUM 5000000 UNITS IJ SOLR
5.0000 10*6.[IU] | Freq: Once | INTRAVENOUS | Status: AC
  Administered 2012-12-06: 5 10*6.[IU] via INTRAVENOUS
  Filled 2012-12-06: qty 5

## 2012-12-06 MED ORDER — ONDANSETRON HCL 4 MG/2ML IJ SOLN: 4.0000 mg | INTRAMUSCULAR | Status: DC | PRN

## 2012-12-06 MED ORDER — ONDANSETRON HCL 4 MG/2ML IJ SOLN: 4.0000 mg | Freq: Four times a day (QID) | INTRAMUSCULAR | Status: DC | PRN

## 2012-12-06 MED ORDER — BENZOCAINE-MENTHOL 20-0.5 % EX AERO: 1.0000 "application " | INHALATION_SPRAY | CUTANEOUS | Status: DC | PRN

## 2012-12-06 MED ORDER — METHYLERGONOVINE MALEATE 0.2 MG/ML IJ SOLN: 0.2000 mg | INTRAMUSCULAR | Status: DC | PRN

## 2012-12-06 NOTE — H&P (Signed)
Suzanne Ramirez is a 32 y.o. female, G3 P2002, EGA 39+ weeks presenting for elective induction with favorable cervix.  Prenatal care essentially uncomplicated, see prenatal records for complete history.  Maternal Medical History:  Fetal activity: Perceived fetal activity is normal.    Prenatal complications: no prenatal complications   OB History    Grav Para Term Preterm Abortions TAB SAB Ect Mult Living   3 2 2       2     SVD at term x 2, no comps  Past Medical History  Diagnosis Date  . No pertinent past medical history   . Normal vaginal delivery     2007 and 2010   Past Surgical History  Procedure Date  . No past surgeries    Family History: family history is negative for Other. Social History:  reports that she has never smoked. She does not have any smokeless tobacco history on file. She reports that she does not drink alcohol or use illicit drugs.   Prenatal Transfer Tool  Maternal Diabetes: No Genetic Screening: Declined Maternal Ultrasounds/Referrals: Normal Fetal Ultrasounds or other Referrals:  None Maternal Substance Abuse:  No Significant Maternal Medications:  None Significant Maternal Lab Results:  Lab values include: Group B Strep positive Other Comments:  None  Review of Systems  Respiratory: Negative.   Cardiovascular: Negative.     Dilation: 4 Effacement (%): 90 Station: 0 Exam by:: Dr. Jackelyn Knife Blood pressure 114/76, pulse 90, temperature 97.6 F (36.4 C), temperature source Oral, resp. rate 18, height 5\' 4"  (1.626 m), weight 66 kg (145 lb 8.1 oz), last menstrual period 03/06/2012. Maternal Exam:  Abdomen: Estimated fetal weight is 7 1/2 lbs.   Fetal presentation: vertex  Introitus: Normal vulva. Normal vagina.  Amniotic fluid character: clear.  Pelvis: adequate for delivery.   Cervix: Cervix evaluated by digital exam.     Fetal Exam Fetal Monitor Review: Mode: ultrasound.   Baseline rate: 140.  Variability: moderate (6-25 bpm).     Pattern: early decelerations and accelerations present.    Fetal State Assessment: Category I - tracings are normal.     Physical Exam  Constitutional: She appears well-developed and well-nourished.  Cardiovascular: Normal rate, regular rhythm and normal heart sounds.   No murmur heard. Respiratory: Effort normal and breath sounds normal. No respiratory distress. She has no wheezes.  GI: Soft.       Gravid     Prenatal labs: ABO, Rh: AB/Positive/-- (08/20 0000) Antibody: Negative (08/20 0000) Rubella: Immune (08/20 0000) RPR: Nonreactive (08/20 0000)  HBsAg: Negative (08/20 0000)  HIV: Non-reactive (08/20 0000)  GBS: Positive (01/02 0000)  GCT:  103  Assessment/Plan: IUP at 39+ weeks for elective induction.  On pitocin, AROM done.  Monitor progress, anticipate SVD.  On PCN for +GBS.   Naria Abbey D 12/06/2012, 9:10 AM

## 2012-12-06 NOTE — Anesthesia Preprocedure Evaluation (Signed)

## 2012-12-06 NOTE — Anesthesia Procedure Notes (Signed)
Epidural Patient location during procedure: OB Start time: 12/06/2012 9:57 AM End time: 12/06/2012 10:01 AM  Staffing Anesthesiologist: Sandrea Hughs Performed by: anesthesiologist   Preanesthetic Checklist Completed: patient identified, site marked, surgical consent, pre-op evaluation, timeout performed, IV checked, risks and benefits discussed and monitors and equipment checked  Epidural Patient position: sitting Prep: site prepped and draped and DuraPrep Patient monitoring: continuous pulse ox and blood pressure Approach: midline Injection technique: LOR air  Needle:  Needle type: Tuohy  Needle gauge: 17 G Needle length: 9 cm and 9 Needle insertion depth: 5 cm cm Catheter type: closed end flexible Catheter size: 19 Gauge Catheter at skin depth: 10 cm Test dose: negative and Other  Assessment Sensory level: T9 Events: blood not aspirated, injection not painful, no injection resistance, negative IV test and no paresthesia  Additional Notes Reason for block:procedure for pain

## 2012-12-07 NOTE — Discharge Summary (Signed)
Obstetric Discharge Summary Reason for Admission: induction of labor Prenatal Procedures: none Intrapartum Procedures: spontaneous vaginal delivery Postpartum Procedures: none Complications-Operative and Postpartum: 1st degree perineal laceration Hemoglobin  Date Value Range Status  12/06/2012 11.2* 12.0 - 15.0 g/dL Final     HCT  Date Value Range Status  12/06/2012 34.5* 36.0 - 46.0 % Final    Physical Exam:  General: alert Lochia: appropriate Uterine Fundus: firm  Discharge Diagnoses: Term Pregnancy-delivered  Discharge Information: Date: 12/07/2012 Activity: pelvic rest Diet: routine Medications: Ibuprofen Condition: stable Instructions: refer to practice specific booklet Discharge to: home Follow-up Information    Follow up with Deanndra Kirley D, MD. In 6 weeks.   Contact information:   [address], [address] [place] [address] [phone]          Newborn Data: Live born female  Birth Weight: 7 lb 15 oz (3600 g) APGAR: 8, 9  Home with mother.  Jaymian Bogart D 12/07/2012, 12:20 PM

## 2012-12-07 NOTE — Anesthesia Postprocedure Evaluation (Signed)
Anesthesia Post Note  Patient: Suzanne Ramirez  Procedure(s) Performed: * No procedures listed *  Anesthesia type: Epidural  Patient location: Mother/Baby  Post pain: Pain level controlled  Post assessment: Post-op Vital signs reviewed  Last Vitals:  Filed Vitals:   12/07/12 0515  BP: 102/50  Pulse: 85  Temp: 36.6 C  Resp: 16    Post vital signs: Reviewed  Level of consciousness: awake  Complications: No apparent anesthesia complications

## 2012-12-07 NOTE — Progress Notes (Signed)
PPD #1 No problems Afeb, VSS Fundus firm, NT at U-0 Continue routine postpartum care 

## 2012-12-21 ENCOUNTER — Ambulatory Visit (HOSPITAL_COMMUNITY): Admission: RE | Admit: 2012-12-21 | Payer: No Typology Code available for payment source | Source: Ambulatory Visit

## 2012-12-28 ENCOUNTER — Ambulatory Visit (HOSPITAL_COMMUNITY)
Admission: RE | Admit: 2012-12-28 | Discharge: 2012-12-28 | Disposition: A | Discharge status: Home / Self Care | Payer: No Typology Code available for payment source | Source: Ambulatory Visit | Attending: Obstetrics and Gynecology | Admitting: Obstetrics and Gynecology

## 2012-12-28 NOTE — Lactation Note (Addendum)
Adult Lactation Consultation Outpatient Visit Note  (52 weeks old)  Patient Name: Suzanne Ramirez              UJWJ name: [name]   Date of Birth: 06/03/1981                             DOB: [birth year] Gestational Age at Delivery: Unknown       39.3 weeks Type of Delivery:   Vaginal                          Birth weight: 7#15  Discharge weight: 7#12  Today's weight: 8#7.1  Breastfeeding History: Frequency of Breastfeeding:  Every 2-3 hrs Length of Feeding: 20 mins per breast Voids: QS Stools: QS  Supplementing / Method: Pumping:  Type of Pump:  Evenflow double pump   Frequency:       3-4 times a day   Volume:             15 ml  Supplements with 30-60 ml of formula by bottle (Avent bottle).  She has been doing this for 2 weeks now, and is concerned about losing her milk supply.  Mom does have a history of "needing" to supplement with her 2 other children.  Not sure if the early supplementation leads to decreasing her milk supply, or if she does have an essential milk supply problem.  Mom not pumping with a strong hospital grade pump at present, and is not pumping frequently enough.  Called [facility] office to inquire about a loaner.     Comments: Baby noted to have trouble settling in to latch to left breast.  He would latch and then pop off and scream.  He did this several times.  On closer look, baby has a white coating on tongue.  Mom denies any burning, stinging, or itching of nipples.  No diaper rash seen.  Baby did settle down, and latch and nurse well for 15 minutes, but at initial latch, baby was frantic.  Talked about symptoms of thrush (difficultly latching and staying latched), and treatment needing an Rx from Pediatrician.     Consultation Evaluation: Baby is able to transfer 30 ml from both breasts total, with 15 min on each side. Teaching on using the cross cradle hold given, as Mom was using the cradle hold, and baby was popping on and off.  He tended to tuck in his lower lip,  and demonstrated how to uncurl this to improve seal and comfort.  Encouraged Mom to use breast compression during the feeding to increase milk flow.  His typical feeding at his weight and age, should be about 75 ml.  Talked about ways to supplement, and gave Mom some slow flow nipples, as baby has trouble staying latched onto Avent nipple bottle.    Plan is:  to breast feed often on cue, and follow with 30-60 ml EBM +/ formula by slow flow bottle Pump both breasts within an hour of breast feeding.  Obtain a double electric breast pump from [facility]  Follow-Up  To call our office for next appointment.  Will work on her milk supply, information on Fenugreek, and [place] given.  Handout on Thrush treatment given.    [name] 12/28/2012, 12:00 PM

## 2012-12-29 ENCOUNTER — Ambulatory Visit (HOSPITAL_COMMUNITY): Payer: No Typology Code available for payment source

## 2013-01-05 IMAGING — CR DG ABDOMEN 1V
1 series · 1 of 1 positions shown · non-contrast
Comparison: None.

CLINICAL DATA: Constipation and abdominal pain.

ABDOMEN - 1 VIEW

[t abdomen supine]
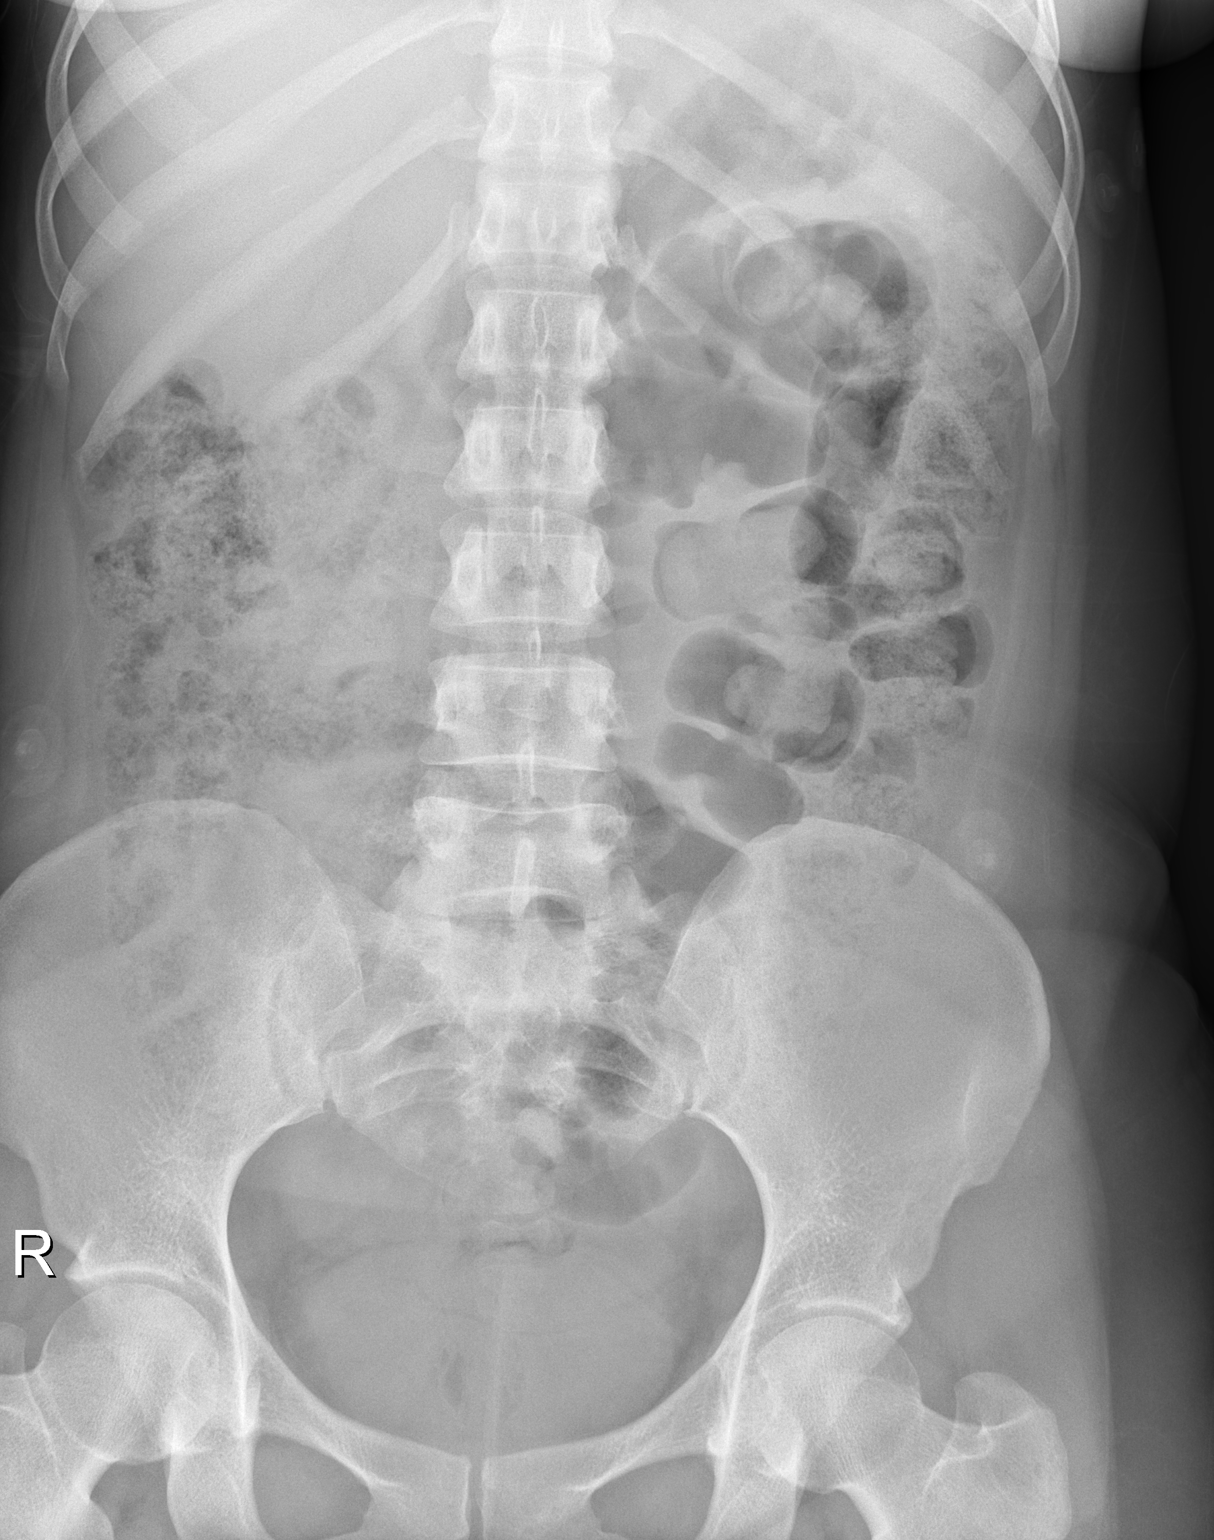

[1 of 1 positions shown; findings below may reference images not displayed]

FINDINGS: Single view of the abdomen was obtained.  There is a
large amount of stool throughout the abdomen.  There is gas within
the colon.  No significant small bowel gaseous distention.  Limited
evaluation for renal calculi due to the large stool burden.  No
gross bony abnormality.
IMPRESSION: Large amount of stool throughout the abdomen.  Findings are
compatible with history of constipation.

## 2013-04-12 ENCOUNTER — Emergency Department (HOSPITAL_COMMUNITY)
Admission: EM | Admit: 2013-04-12 | Discharge: 2013-04-12 | Disposition: A | Discharge status: Home / Self Care | Payer: No Typology Code available for payment source | Attending: Emergency Medicine | Admitting: Emergency Medicine

## 2013-04-12 ENCOUNTER — Encounter (HOSPITAL_COMMUNITY): Payer: Self-pay | Admitting: Emergency Medicine

## 2013-04-12 DIAGNOSIS — Z79899 Other long term (current) drug therapy: Secondary | ICD-10-CM | POA: Insufficient documentation

## 2013-04-12 DIAGNOSIS — R1011 Right upper quadrant pain: Secondary | ICD-10-CM

## 2013-04-12 DIAGNOSIS — R109 Unspecified abdominal pain: Secondary | ICD-10-CM | POA: Insufficient documentation

## 2013-04-12 HISTORY — DX: Calculus of gallbladder without cholecystitis without obstruction: K80.20

## 2013-04-12 LAB — POCT I-STAT, CHEM 8
BUN: 10 mg/dL (ref 6–23)
Calcium, Ion: 1.23 mmol/L (ref 1.12–1.23)
Chloride: 109 mEq/L (ref 96–112)
Sodium: 142 mEq/L (ref 135–145)

## 2013-04-12 LAB — HEPATIC FUNCTION PANEL
ALT: 18 U/L (ref 0–35)
AST: 21 U/L (ref 0–37)
Albumin: 4.2 g/dL (ref 3.5–5.2)
Alkaline Phosphatase: 72 U/L (ref 39–117)
Bilirubin, Direct: <0.1 mg/dL (ref 0.0–0.3)
Total Bilirubin: 0.2 mg/dL — ABNORMAL LOW (ref 0.3–1.2)
Total Protein: 8.2 g/dL (ref 6.0–8.3)

## 2013-04-12 LAB — CBC WITH DIFFERENTIAL/PLATELET
Eosinophils Relative: 1 % (ref 0–5)
Lymphocytes Relative: 55 % — ABNORMAL HIGH (ref 12–46)
Lymphs Abs: 3.7 10*3/uL (ref 0.7–4.0)
MCV: 75 fL — ABNORMAL LOW (ref 78.0–100.0)
Platelets: 332 10*3/uL (ref 150–400)
RBC: 4.92 MIL/uL (ref 3.87–5.11)
WBC: 6.9 10*3/uL (ref 4.0–10.5)

## 2013-04-12 LAB — LIPASE, BLOOD: Lipase: 33 U/L (ref 11–59)

## 2013-04-12 MED ORDER — HYDROCODONE-ACETAMINOPHEN 5-325 MG PO TABS: 1.0000 | ORAL_TABLET | Freq: Four times a day (QID) | ORAL | Status: DC | PRN

## 2013-04-12 NOTE — ED Notes (Signed)
Patient requesting discharge at this time, states she needs to get home to her child. EDP notified.

## 2013-04-12 NOTE — ED Notes (Signed)
Patient with increased abdominal pain, started acutely this evening.  Patient denies any nausea or vomiting.  Patient states she does have gallstones and has not had them out yet.

## 2013-04-12 NOTE — ED Notes (Signed)
Pt states she is unable to void at this time.

## 2013-04-12 NOTE — ED Notes (Signed)
Pt unable at this time to give a urine.

## 2013-04-12 NOTE — ED Notes (Signed)
Patient given discharge instructions for abdominal pain. rx for norco provided. Advised to follow up with primary care. Patient voiced understanding of all instructions an had no further questions. Patient ambulated to lobby

## 2013-04-12 NOTE — ED Provider Notes (Signed)
History     CSN: [id number]  Arrival date & time 04/12/13  1910   First MD Initiated Contact with Patient 04/12/13 2019      Chief Complaint  Patient presents with  . Abdominal Pain    (Consider location/radiation/quality/duration/timing/severity/associated sxs/prior treatment) HPI Patient presents to the emergency room with abdominal pain, that occurred around 7 PM.  The patient, states currently she does not have any abdominal pain.  The patient denies nausea, vomiting, diarrhea, weakness, chest pain, shortness of breath, fever, weakness,vaginal discharge, vaginal bleeding, back pain or dysuria. patient states she did not take any medications prior to arrival.  She states, that she's had gallstones in the past and this felt similar to her previous attacks.patient states nothing seems to make her condition worse. Past Medical History  Diagnosis Date  . No pertinent past medical history   . Normal vaginal delivery     2007 and 2010  . Gallstones     Past Surgical History  Procedure Laterality Date  . No past surgeries      Family History  Problem Relation Age of Onset  . Other Neg Hx     History  Substance Use Topics  . Smoking status: Never Smoker   . Smokeless tobacco: Not on file  . Alcohol Use: No    OB History   Grav Para Term Preterm Abortions TAB SAB Ect Mult Living   3 3 3       3       Review of Systems All other systems negative except as documented in the HPI. All pertinent positives and negatives as reviewed in the HPI. Allergies  Review of patient's allergies indicates no known allergies.  Home Medications   Current Outpatient Rx  Name  Route  Sig  Dispense  Refill  . HYDROcodone-acetaminophen (NORCO/VICODIN) 5-325 MG per tablet   Oral   Take 1 tablet by mouth every 6 (six) hours as needed for pain.           BP 104/58  Pulse 78  Temp(Src) 98.7 F (37.1 C) (Oral)  Resp 16  SpO2 100%  LMP 04/10/2013  Breastfeeding? No  Physical Exam   Nursing note and vitals reviewed. Constitutional: She is oriented to person, place, and time. She appears well-developed and well-nourished.  HENT:  Head: Normocephalic and atraumatic.  Mouth/Throat: Oropharynx is clear and moist.  Eyes: Pupils are equal, round, and reactive to light.  Neck: Normal range of motion. Neck supple.  Cardiovascular: Normal rate, regular rhythm and normal heart sounds.  Exam reveals no gallop and no friction rub.   No murmur heard. Pulmonary/Chest: Effort normal and breath sounds normal. No respiratory distress.  Abdominal: Soft. Bowel sounds are normal. She exhibits no distension. There is no tenderness. There is no guarding.  Neurological: She is alert and oriented to person, place, and time. She exhibits normal muscle tone. Coordination normal.  Skin: Skin is warm and dry.    ED Course  Procedures (including critical care time)  Labs Reviewed  CBC WITH DIFFERENTIAL - Abnormal; Notable for the following:    MCV 75.0 (*)    MCH 25.0 (*)    Neutrophils Relative % 38 (*)    Lymphocytes Relative 55 (*)    All other components within normal limits  HEPATIC FUNCTION PANEL - Abnormal; Notable for the following:    Total Bilirubin 0.2 (*)    All other components within normal limits  POCT I-STAT, CHEM 8 - Abnormal; Notable for the  following:    Glucose, Bld 135 (*)    All other components within normal limits  LIPASE, BLOOD  URINALYSIS, ROUTINE W REFLEX MICROSCOPIC   Patient currently does not have any pain here in the emergency Department and all of her testing appears normal. the patient will be referred to surgery for further evaluation and careshe is told to return here as needed.   MDM         Carlyle Dolly, PA-C 04/12/13 2234

## 2013-04-12 NOTE — ED Provider Notes (Signed)
Medical screening examination/treatment/procedure(s) were performed by non-physician practitioner and as supervising physician I was immediately available for consultation/collaboration.  Nelva Hauk T Malachy Coleman, MD 04/12/13 2322 

## 2014-09-17 ENCOUNTER — Encounter (HOSPITAL_COMMUNITY): Payer: Self-pay | Admitting: Emergency Medicine

## 2014-10-18 ENCOUNTER — Emergency Department (HOSPITAL_COMMUNITY)
Admission: EM | Admit: 2014-10-18 | Discharge: 2014-10-18 | Disposition: A | Discharge status: Home / Self Care | Payer: No Typology Code available for payment source | Attending: Emergency Medicine | Admitting: Emergency Medicine

## 2014-10-18 ENCOUNTER — Encounter (HOSPITAL_COMMUNITY): Payer: Self-pay | Admitting: *Deleted

## 2014-10-18 DIAGNOSIS — Z3202 Encounter for pregnancy test, result negative: Secondary | ICD-10-CM | POA: Insufficient documentation

## 2014-10-18 DIAGNOSIS — K8050 Calculus of bile duct without cholangitis or cholecystitis without obstruction: Secondary | ICD-10-CM

## 2014-10-18 DIAGNOSIS — R1011 Right upper quadrant pain: Secondary | ICD-10-CM | POA: Insufficient documentation

## 2014-10-18 LAB — URINALYSIS, ROUTINE W REFLEX MICROSCOPIC
Bilirubin Urine: NEGATIVE
GLUCOSE, UA: NEGATIVE mg/dL
HGB URINE DIPSTICK: NEGATIVE
Ketones, ur: NEGATIVE mg/dL
LEUKOCYTES UA: NEGATIVE
Nitrite: NEGATIVE
PROTEIN: NEGATIVE mg/dL
Specific Gravity, Urine: 1.012 (ref 1.005–1.030)
Urobilinogen, UA: 0.2 mg/dL (ref 0.0–1.0)
pH: 5 (ref 5.0–8.0)

## 2014-10-18 LAB — COMPREHENSIVE METABOLIC PANEL
ALBUMIN: 4 g/dL (ref 3.5–5.2)
ALT: 13 U/L (ref 0–35)
ANION GAP: 13 (ref 5–15)
AST: 15 U/L (ref 0–37)
Alkaline Phosphatase: 55 U/L (ref 39–117)
BUN: 14 mg/dL (ref 6–23)
CHLORIDE: 103 meq/L (ref 96–112)
CO2: 22 mEq/L (ref 19–32)
Calcium: 9.3 mg/dL (ref 8.4–10.5)
Creatinine, Ser: 0.56 mg/dL (ref 0.50–1.10)
GFR calc Af Amer: >90 mL/min (ref >90)
GFR calc non Af Amer: >90 mL/min (ref >90)
GLUCOSE: 112 mg/dL — AB (ref 70–99)
Potassium: 4.2 mEq/L (ref 3.7–5.3)
SODIUM: 138 meq/L (ref 137–147)
Total Protein: 7.7 g/dL (ref 6.0–8.3)

## 2014-10-18 LAB — CBC WITH DIFFERENTIAL/PLATELET
BASOS ABS: 0 10*3/uL (ref 0.0–0.1)
Basophils Relative: 0 % (ref 0–1)
EOS PCT: 1 % (ref 0–5)
Eosinophils Absolute: 0.1 10*3/uL (ref 0.0–0.7)
HEMATOCRIT: 35.6 % — AB (ref 36.0–46.0)
HEMOGLOBIN: 11.5 g/dL — AB (ref 12.0–15.0)
LYMPHS ABS: 4 10*3/uL (ref 0.7–4.0)
LYMPHS PCT: 49 % — AB (ref 12–46)
MCH: 25.2 pg — ABNORMAL LOW (ref 26.0–34.0)
MCHC: 32.3 g/dL (ref 30.0–36.0)
MCV: 78.1 fL (ref 78.0–100.0)
MONOS PCT: 5 % (ref 3–12)
Monocytes Absolute: 0.4 10*3/uL (ref 0.1–1.0)
NEUTROS ABS: 3.6 10*3/uL (ref 1.7–7.7)
Neutrophils Relative %: 45 % (ref 43–77)
Platelets: 321 10*3/uL (ref 150–400)
RBC: 4.56 MIL/uL (ref 3.87–5.11)
RDW: 13.6 % (ref 11.5–15.5)
WBC: 8.1 10*3/uL (ref 4.0–10.5)

## 2014-10-18 LAB — POC URINE PREG, ED: Preg Test, Ur: NEGATIVE

## 2014-10-18 LAB — LIPASE, BLOOD: Lipase: 31 U/L (ref 11–59)

## 2014-10-18 MED ORDER — HYDROCODONE-ACETAMINOPHEN 5-325 MG PO TABS: 2.0000 | ORAL_TABLET | ORAL | Status: DC | PRN

## 2014-10-18 NOTE — ED Notes (Signed)
Pt states that she has been having intermittent abd pain x1 year, states she was seen and told she needed to have her gallbladder removed but got pregnant so was never able to have the surgery. Pt denies any v/d or fevers at home. Denies any genitourinary symptoms.

## 2014-10-18 NOTE — ED Provider Notes (Signed)
CSN: [id number]     Arrival date & time 10/18/14  1337 History   First MD Initiated Contact with Patient 10/18/14 1624     Chief Complaint  Patient presents with  . Abdominal Pain     (Consider location/radiation/quality/duration/timing/severity/associated sxs/prior Treatment) Patient is a 33 y.o. female presenting with abdominal pain.  Abdominal Pain Associated symptoms: no constipation, no diarrhea, no nausea and no vomiting    Patient is a otherwise healthy 33 year old female presenting with intermittent right upper quadrant pain. She was previously diagnosed with symptomatically lithiasis and was scheduled to undergo cholecystectomy, but then became pregnant, so her surgery was postponed. This was more than a year ago, and she has had persistent right upper quadrant pain, worse after meals, and sometimes in the evenings since that time. She denies any pain today. She has had no fevers nausea or vomiting or diarrhea. She denies any radiation of this pain to her back. She says at its worst the pain is a 5 out of 10, and is not relieved by over-the-counter pain medications.   Past Medical History  Diagnosis Date  . No pertinent past medical history   . Normal vaginal delivery     2007 and 2010  . Gallstones    Past Surgical History  Procedure Laterality Date  . No past surgeries     Family History  Problem Relation Age of Onset  . Other Neg Hx    History  Substance Use Topics  . Smoking status: Never Smoker   . Smokeless tobacco: Not on file  . Alcohol Use: No   OB History    Gravida Para Term Preterm AB TAB SAB Ectopic Multiple Living   3 3 3       3      Review of Systems  Gastrointestinal: Positive for abdominal pain. Negative for nausea, vomiting, diarrhea, constipation and blood in stool.  Musculoskeletal: Negative for back pain.  All other systems reviewed and are negative.     Allergies  Review of patient's allergies indicates no known allergies.  Home  Medications   Prior to Admission medications   Medication Sig Start Date End Date Taking? Authorizing Provider  HYDROcodone-acetaminophen (NORCO/VICODIN) 5-325 MG per tablet Take 2 tablets by mouth every 4 (four) hours as needed for moderate pain or severe pain. 10/18/14   Erskine Emeryhris Joby Hershkowitz, MD   BP 112/89 mmHg  Pulse 75  Temp(Src) 98.7 F (37.1 C) (Oral)  Resp 16  Ht 5\' 4"  (1.626 m)  Wt 155 lb (70.308 kg)  BMI 26.59 kg/m2  SpO2 100%  LMP 09/25/2014 (Exact Date) Physical Exam  Constitutional: She is oriented to person, place, and time. She appears well-developed and well-nourished. No distress.  HENT:  Head: Normocephalic and atraumatic.  Eyes: Conjunctivae are normal. Pupils are equal, round, and reactive to light.  Neck: Normal range of motion. No JVD present.  Cardiovascular: Normal rate, regular rhythm and normal heart sounds.   No murmur heard. Pulmonary/Chest: Effort normal and breath sounds normal. No respiratory distress.  Abdominal: Soft. Bowel sounds are normal. There is no tenderness. There is no guarding, no CVA tenderness, no tenderness at McBurney's point and negative Murphy's sign.  Musculoskeletal: Normal range of motion. She exhibits no edema.  Neurological: She is alert and oriented to person, place, and time.  Skin: Skin is warm and dry.  Nursing note and vitals reviewed.   ED Course  Procedures (including critical care time) Labs Review Labs Reviewed  COMPREHENSIVE METABOLIC PANEL - Abnormal;  Notable for the following:    Glucose, Bld 112 (*)    Total Bilirubin <0.2 (*)    All other components within normal limits  CBC WITH DIFFERENTIAL - Abnormal; Notable for the following:    Hemoglobin 11.5 (*)    HCT 35.6 (*)    MCH 25.2 (*)    Lymphocytes Relative 49 (*)    All other components within normal limits  LIPASE, BLOOD  URINALYSIS, ROUTINE W REFLEX MICROSCOPIC  POC URINE PREG, ED    Imaging Review No results found.   EKG Interpretation None       EMERGENCY DEPARTMENT BILIARY ULTRASOUND INTERPRETATION "Study: Limited Abdominal Ultrasound of the gallbladder and common bile duct."  INDICATIONS: RUQ pain Indication: Multiple views of the gallbladder and common bile duct were obtained in real-time with a Multi-frequency probe." PERFORMED BY:  Myself IMAGES ARCHIVED?: No FINDINGS: Gallstones present, Gallbladder wall normal in thickness, Sonographic Murphy's sign absent and Common bile duct normal in size LIMITATIONS: Body Habitus INTERPRETATION: Cholelithiasis   MDM   Final diagnoses:  Right upper quadrant pain  Biliary colic   33 year old female presenting with right upper quadrant pain, likely symptomatic cholelithiasis. Her pain is intermittent and not present now. It has been chronic and going on for the past several years. She had previously been diagnosed with this, and was scheduled for cholecystectomy. There has been no change in the character or pattern of the symptoms since her initial diagnosis, she simply was unable to get in to get her surgery. She has no abdominal pain now, she has no McBurney's point tenderness, no Murphy's sign, no other abdominal tenderness to suggest other causes of her pain. No elevated white blood cell count, no transaminitis elevated alkaline phosphatase or bilirubin to suggest choledocholithiasis. She is afebrile. Remainder of laboratory evaluation unremarkable, she is not pregnant, and has no evidence of urinary tract infection.  We will treat her symptomatically, and have her follow-up with surgery.     Erskine Emeryhris Janella Rogala, MD 10/19/14 [id number]  Hilario Quarryanielle S Ray, MD 10/20/14 [phone]

## 2014-10-18 NOTE — ED Notes (Signed)
Patient states she was supposed to have gb removed x 1 year ago but became pregnant and had baby so surgery was delayed.  Patient states now surgical center states since it has been 1 year she has to start over with workup. Patient states ruq pain with associated nausea and bloating

## 2014-10-18 NOTE — Discharge Instructions (Signed)
Biliary Colic  °Biliary colic is a steady or irregular pain in the upper abdomen. It is usually under the right side of the rib cage. It happens when gallstones interfere with the normal flow of bile from the gallbladder. Bile is a liquid that helps to digest fats. Bile is made in the liver and stored in the gallbladder. When you eat a meal, bile passes from the gallbladder through the cystic duct and the common bile duct into the small intestine. There, it mixes with partially digested food. If a gallstone blocks either of these ducts, the normal flow of bile is blocked. The muscle cells in the bile duct contract forcefully to try to move the stone. This causes the pain of biliary colic.  °SYMPTOMS  °· A person with biliary colic usually complains of pain in the upper abdomen. This pain can be: °¨ In the center of the upper abdomen just below the breastbone. °¨ In the upper-right part of the abdomen, near the gallbladder and liver. °¨ Spread back toward the right shoulder blade. °· Nausea and vomiting. °· The pain usually occurs after eating. °· Biliary colic is usually triggered by the digestive system's demand for bile. The demand for bile is high after fatty meals. Symptoms can also occur when a person who has been fasting suddenly eats a very large meal. Most episodes of biliary colic pass after 1 to 5 hours. After the most intense pain passes, your abdomen may continue to ache mildly for about 24 hours. °DIAGNOSIS  °After you describe your symptoms, your caregiver will perform a physical exam. He or she will pay attention to the upper right portion of your belly (abdomen). This is the area of your liver and gallbladder. An ultrasound will help your caregiver look for gallstones. Specialized scans of the gallbladder may also be done. Blood tests may be done, especially if you have fever or if your pain persists. °PREVENTION  °Biliary colic can be prevented by controlling the risk factors for gallstones. Some of  these risk factors, such as heredity, increasing age, and pregnancy are a normal part of life. Obesity and a high-fat diet are risk factors you can change through a healthy lifestyle. Women going through menopause who take hormone replacement therapy (estrogen) are also more likely to develop biliary colic. °TREATMENT  °· Pain medication may be prescribed. °· You may be encouraged to eat a fat-free diet. °· If the first episode of biliary colic is severe, or episodes of colic keep retuning, surgery to remove the gallbladder (cholecystectomy) is usually recommended. This procedure can be done through small incisions using an instrument called a laparoscope. The procedure often requires a brief stay in the hospital. Some people can leave the hospital the same day. It is the most widely used treatment in people troubled by painful gallstones. It is effective and safe, with no complications in more than 90% of cases. °· If surgery cannot be done, medication that dissolves gallstones may be used. This medication is expensive and can take months or years to work. Only small stones will dissolve. °· Rarely, medication to dissolve gallstones is combined with a procedure called shock-wave lithotripsy. This procedure uses carefully aimed shock waves to break up gallstones. In many people treated with this procedure, gallstones form again within a few years. °PROGNOSIS  °If gallstones block your cystic duct or common bile duct, you are at risk for repeated episodes of biliary colic. There is also a 25% chance that you will develop   a gallbladder infection(acute cholecystitis), or some other complication of gallstones within 10 to 20 years. If you have surgery, schedule it at a time that is convenient for you and at a time when you are not sick. °HOME CARE INSTRUCTIONS  °· Drink plenty of clear fluids. °· Avoid fatty, greasy or fried foods, or any foods that make your pain worse. °· Take medications as directed. °SEEK MEDICAL  CARE IF:  °· You develop a fever over 100.5° F (38.1° C). °· Your pain gets worse over time. °· You develop nausea that prevents you from eating and drinking. °· You develop vomiting. °SEEK IMMEDIATE MEDICAL CARE IF:  °· You have continuous or severe belly (abdominal) pain which is not relieved with medications. °· You develop nausea and vomiting which is not relieved with medications. °· You have symptoms of biliary colic and you suddenly develop a fever and shaking chills. This may signal cholecystitis. Call your caregiver immediately. °· You develop a yellow color to your skin or the white part of your eyes (jaundice). °Document Released: 04/05/2006 Document Revised: 01/25/2012 Document Reviewed: 06/14/2008 °[employer]® Patient Information ©2015 [employer]. This information is not intended to replace advice given to you by your health care provider. Make sure you discuss any questions you have with your health care provider. ° °

## 2014-10-31 ENCOUNTER — Other Ambulatory Visit (INDEPENDENT_AMBULATORY_CARE_PROVIDER_SITE_OTHER): Payer: Self-pay | Admitting: Surgery

## 2014-10-31 NOTE — H&P (Signed)
Suzanne Ramirez 10/31/2014 9:52 AM Location: [facility] Patient #: [id number] DOB: [birth year] Married / Language: English / Race: Black or African American Female History of Present Illness Suzanne Ramirez(Suzanne Ramirez; 10/31/2014 10:43 AM) Patient words: gallbladder.  The patient is a 33 year old female who presents for evaluation of gall stones. Patient sent back from her primary care physician, Dr. Ralene Okoy Ramirez, for concerns of persistent symptomatic gallstones. Pleasant woman originally from [place]. Had classic story for biliary colic. I saw her in 2013. I recommended cholecystectomy. She found out she was pregnant right after surgery was scheduled. Therefore we delayed. Had baby delivered January 2014. She intermittently travelled back to [place] especially 2014. She has been to the ER intermittently for the past year or so with attacks. Recommendation was made to reconsider surgery. Apparently appointment fell through last May. She had worsening attacks. Went to the emergency room again this month. Again recommended to have surgery. She comes today with her husband. She notes she gets upper abdominal pain that radiates to the right side and back and shoulder. Full-blooded nauseated. Usually triggered by eating. She is tried to transition to a more bland diet. Even oatmeal trigger it now. She denies heartburn or reflux. Normally moderately active. No abdominal surgeries. She does not smoke. No history of irritable bowel syndrome, inflammatory bowel disease, etc. No major bleeding. No hematemesis. No improvement with over-the-counter medications. She is now ready to consider surgery. Other Problems Gilmer Mor(Suzanne Ramirez, CMA; 10/31/2014 9:57 AM) No pertinent past medical history  Past Surgical History Gilmer Mor(Suzanne Ramirez, CMA; 10/31/2014 9:57 AM) No pertinent past surgical history  Diagnostic Studies History Gilmer Mor(Suzanne Ramirez, CMA; 10/31/2014 9:57 AM) Colonoscopy never Mammogram  never Pap Smear never  Allergies Suzanne Ramirez(Suzanne Ramirez, CMA; 10/31/2014 9:58 AM) No Known Drug Allergies 10/31/2014  Medication History (Suzanne Ramirez, CMA; 10/31/2014 9:58 AM) Hydrocodone-Acetaminophen (5-325MG  Tablet, Oral as needed) Active.  Social History Gilmer Mor(Suzanne Ramirez, CMA; 10/31/2014 9:57 AM) No alcohol use No drug use Tobacco use Never smoker.  Family History Gilmer Mor(Suzanne Ramirez, CMA; 10/31/2014 9:57 AM) First Degree Relatives No pertinent family history  Pregnancy / Birth History Gilmer Mor(Suzanne Ramirez, CMA; 10/31/2014 9:57 AM) Age at menarche 13 years. Gravida 3 Para 3 Regular periods     Review of Systems (Suzanne Ramirez CMA; 10/31/2014 9:57 AM) General Not Present- Appetite Loss, Chills, Fatigue, Fever, Night Sweats, Weight Gain and Weight Loss. Skin Not Present- Change in Wart/Mole, Dryness, Hives, Jaundice, New Lesions, Non-Healing Wounds, Rash and Ulcer. HEENT Not Present- Earache, Hearing Loss, Hoarseness, Nose Bleed, Oral Ulcers, Ringing in the Ears, Seasonal Allergies, Sinus Pain, Sore Throat, Visual Disturbances, Wears glasses/contact lenses and Yellow Eyes. Respiratory Not Present- Bloody sputum, Chronic Cough, Difficulty Breathing, Snoring and Wheezing. Breast Not Present- Breast Mass, Breast Pain, Nipple Discharge and Skin Changes. Cardiovascular Not Present- Chest Pain, Difficulty Breathing Lying Down, Leg Cramps, Palpitations, Rapid Heart Rate, Shortness of Breath and Swelling of Extremities. Gastrointestinal Present- Change in Bowel Habits, Constipation and Excessive gas. Not Present- Abdominal Pain, Bloating, Bloody Stool, Chronic diarrhea, Difficulty Swallowing, Gets full quickly at meals, Hemorrhoids, Indigestion, Nausea, Rectal Pain and Vomiting. Female Genitourinary Not Present- Frequency, Nocturia, Painful Urination, Pelvic Pain and Urgency. Neurological Not Present- Decreased Memory, Fainting, Headaches, Numbness, Seizures, Tingling, Tremor, Trouble walking and  Weakness. Psychiatric Not Present- Anxiety, Bipolar, Change in Sleep Pattern, Depression, Fearful and Frequent crying. Endocrine Not Present- Cold Intolerance, Excessive Hunger, Hair Changes, Heat Intolerance, Hot flashes and New Diabetes. Hematology Not Present- Easy Bruising, Excessive bleeding, Gland problems, HIV  and Persistent Infections.  Vitals (Suzanne Ramirez CMA; 10/31/2014 9:58 AM) 10/31/2014 9:57 AM Weight: 163 lb Height: 64in Body Surface Area: 1.83 m Body Mass Index: 27.98 kg/m Temp.: 96F(Temporal)  Pulse: 73 (Regular)  BP: 122/78 (Sitting, Left Arm, Standard)     Physical Exam Suzanne Ramirez(Suzanne Ramirez; 10/31/2014 10:41 AM)  General Mental Status-Alert. General Appearance-Not in acute distress, Not Sickly. Orientation-Oriented X3. Hydration-Well hydrated. Voice-Normal.  Integumentary Global Assessment Upon inspection and palpation of skin surfaces of the - Axillae: non-tender, no inflammation or ulceration, no drainage. and Distribution of scalp and body hair is normal. General Characteristics Temperature - normal warmth is noted.  Head and Neck Head-normocephalic, atraumatic with no lesions or palpable masses. Face Global Assessment - atraumatic, no absence of expression. Neck Global Assessment - no abnormal movements, no bruit auscultated on the right, no bruit auscultated on the left, no decreased range of motion, non-tender. Trachea-midline. Thyroid Gland Characteristics - non-tender.  Eye Eyeball - Left-Extraocular movements intact, No Nystagmus. Eyeball - Right-Extraocular movements intact, No Nystagmus. Cornea - Left-No Hazy. Cornea - Right-No Hazy. Sclera/Conjunctiva - Left-No scleral icterus, No Discharge. Sclera/Conjunctiva - Right-No scleral icterus, No Discharge. Pupil - Left-Direct reaction to light normal. Pupil - Right-Direct reaction to light normal.  ENMT Ears Pinna - Left - no drainage observed,  no generalized tenderness observed. Right - no drainage observed, no generalized tenderness observed. Nose and Sinuses External Inspection of the Nose - no destructive lesion observed. Inspection of the nares - Left - quiet respiration. Right - quiet respiration. Mouth and Throat Lips - Upper Lip - no fissures observed, no pallor noted. Lower Lip - no fissures observed, no pallor noted. Nasopharynx - no discharge present. Oral Cavity/Oropharynx - Tongue - no dryness observed. Oral Mucosa - no cyanosis observed. Hypopharynx - no evidence of airway distress observed.  Chest and Lung Exam Inspection Movements - Normal and Symmetrical. Accessory muscles - No use of accessory muscles in breathing. Palpation Palpation of the chest reveals - Non-tender. Auscultation Breath sounds - Normal and Clear.  Cardiovascular Auscultation Rhythm - Regular. Murmurs & Other Heart Sounds - Auscultation of the heart reveals - No Murmurs and No Systolic Clicks.  Abdomen Inspection Inspection of the abdomen reveals - No Visible peristalsis and No Abnormal pulsations. Umbilicus - No Bleeding, No Urine drainage. Palpation/Percussion Palpation and Percussion of the abdomen reveal - Soft, Non Tender, No Rebound tenderness, No Rigidity (guarding) and No Cutaneous hyperesthesia. Note: Mild discomfort in right upper quadrant. No guarding. Abdomen somewhat elastic consistent with postpartum state. No major diastases. No umbilical hernia.   Female Genitourinary Sexual Maturity Tanner 5 - Adult hair pattern. Note: No vaginal bleeding nor discharge   Peripheral Vascular Upper Extremity Inspection - Left - No Cyanotic nailbeds, Not Ischemic. Right - No Cyanotic nailbeds, Not Ischemic.  Neurologic Neurologic evaluation reveals -normal attention span and ability to concentrate, able to name objects and repeat phrases. Appropriate fund of knowledge , normal sensation and normal coordination. Mental Status Affect  - not angry, not paranoid. Cranial Nerves-Normal Bilaterally. Gait-Normal.  Neuropsychiatric Mental status exam performed with findings of-able to articulate well with normal speech/language, rate, volume and coherence, thought content normal with ability to perform basic computations and apply abstract reasoning and no evidence of hallucinations, delusions, obsessions or homicidal/suicidal ideation.  Musculoskeletal Global Assessment Spine, Ribs and Pelvis - no instability, subluxation or laxity. Right Upper Extremity - no instability, subluxation or laxity.  Lymphatic Head & Neck  General Head & Neck Lymphatics: Bilateral -  Description - No Localized lymphadenopathy. Axillary  General Axillary Region: Bilateral - Description - No Localized lymphadenopathy. Femoral & Inguinal  Generalized Femoral & Inguinal Lymphatics: Left - Description - No Localized lymphadenopathy. Right - Description - No Localized lymphadenopathy.    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 Had recommended cholecystectomy in 2013. A pregnancy and then travel to [place] delayed things for a year or so. She is back in the country. She has persistent attacks. She is ready to consider surgery. I discussed at length with the patient and her husband. They wish to proceed. Reasonable single site laparoscopic candidate  Current Plans Schedule for Surgery Pt Education - [facility] Laparosopic Post Op HCI (Neeley Sedivy) Pt Education - [facility] Pain Control (Balian Schaller) Pt Education - [facility] Good Bowel Health (Amenda Duclos) Pt Education - Cholecystitis: cholecystitis

## 2014-11-23 NOTE — Patient Instructions (Addendum)
Suzanne Ramirez  11/23/2014   Your procedure is scheduled on: 12/04/2014    [facility]  Entr[facility] and follow signs to               Short Stay Center at       0830 AM.  Call this number if you have problems the morning of surgery [phone]   Remember:  Do not eat food or drink liquids :After Midnight.     Take these medicines the morning of surgery with A SIP OF WATER: none                                You may not have any metal on your body including hair pins and              piercings  Do not wear jewelry, make-up, lotions, powders or perfumes.             Do not wear nail polish.  Do not shave  48 hours prior to surgery.               Do not bring valuables to the hospital. [facility] IS NOT             RESPONSIBLE   FOR VALUABLES.  Contacts, dentures or bridgework may not be worn into surgery.       Patients discharged the day of surgery will not be allowed to drive home.  Name and phone number of your driver:  Special Instructions: coughing and deep breathing exercises, leg exercises               Please read over the following fact sheets you were given: _____________________________________________________________________             [facility] - Preparing for Surgery Before surgery, you can play an important role.  Because skin is not sterile, your skin needs to be as free of germs as possible.  You can reduce the number of germs on your skin by washing with CHG (chlorahexidine gluconate) soap before surgery.  CHG is an antiseptic cleaner which kills germs and bonds with the skin to continue killing germs even after washing. Please DO NOT use if you have an allergy to CHG or antibacterial soaps.  If your skin becomes reddened/irritated stop using the CHG and inform your nurse when you arrive at Short Stay. Do not shave (including legs and underarms) for at least 48 hours prior to the first CHG shower.  You may shave your  face/neck. Please follow these instructions carefully:  1.  Shower with CHG Soap the night before surgery and the  morning of Surgery.  2.  If you choose to wash your hair, wash your hair first as usual with your  normal  shampoo.  3.  After you shampoo, rinse your hair and body thoroughly to remove the  shampoo.                           4.  Use CHG as you would any other liquid soap.  You can apply chg directly  to the skin and wash                       Gently with a scrungie or clean  washcloth.  5.  Apply the CHG Soap to your body ONLY FROM THE NECK DOWN.   Do not use on face/ open                           Wound or open sores. Avoid contact with eyes, ears mouth and genitals (private parts).                       Wash face,  Genitals (private parts) with your normal soap.             6.  Wash thoroughly, paying special attention to the area where your surgery  will be performed.  7.  Thoroughly rinse your body with warm water from the neck down.  8.  DO NOT shower/wash with your normal soap after using and rinsing off  the CHG Soap.                9.  Pat yourself dry with a clean towel.            10.  Wear clean pajamas.            11.  Place clean sheets on your bed the night of your first shower and do not  sleep with pets. Day of Surgery : Do not apply any lotions/deodorants the morning of surgery.  Please wear clean clothes to the hospital/surgery center.  FAILURE TO FOLLOW THESE INSTRUCTIONS MAY RESULT IN THE CANCELLATION OF YOUR SURGERY PATIENT SIGNATURE_________________________________  NURSE SIGNATURE__________________________________  ________________________________________________________________________

## 2014-11-26 ENCOUNTER — Encounter (INDEPENDENT_AMBULATORY_CARE_PROVIDER_SITE_OTHER): Payer: Self-pay

## 2014-11-26 ENCOUNTER — Encounter (HOSPITAL_COMMUNITY): Payer: Self-pay

## 2014-11-26 ENCOUNTER — Encounter (HOSPITAL_COMMUNITY)
Admission: RE | Admit: 2014-11-26 | Discharge: 2014-11-26 | Disposition: A | Discharge status: Home / Self Care | Payer: 59 | Source: Ambulatory Visit | Attending: Surgery | Admitting: Surgery

## 2014-11-26 DIAGNOSIS — Z01818 Encounter for other preprocedural examination: Secondary | ICD-10-CM | POA: Insufficient documentation

## 2014-11-26 DIAGNOSIS — Z01812 Encounter for preprocedural laboratory examination: Secondary | ICD-10-CM | POA: Diagnosis not present

## 2014-11-26 DIAGNOSIS — K8050 Calculus of bile duct without cholangitis or cholecystitis without obstruction: Secondary | ICD-10-CM | POA: Diagnosis not present

## 2014-11-26 LAB — CBC
HCT: 37.6 % (ref 36.0–46.0)
Hemoglobin: 12 g/dL (ref 12.0–15.0)
MCH: 25.4 pg — ABNORMAL LOW (ref 26.0–34.0)
MCHC: 31.9 g/dL (ref 30.0–36.0)
MCV: 79.7 fL (ref 78.0–100.0)
Platelets: 310 10*3/uL (ref 150–400)
RBC: 4.72 MIL/uL (ref 3.87–5.11)
RDW: 13.3 % (ref 11.5–15.5)
WBC: 7.7 10*3/uL (ref 4.0–10.5)

## 2014-11-26 LAB — HCG, SERUM, QUALITATIVE: Preg, Serum: NEGATIVE

## 2014-12-04 ENCOUNTER — Encounter (HOSPITAL_COMMUNITY)
Admission: RE | Disposition: A | Discharge status: Home / Self Care | Payer: Self-pay | Source: Ambulatory Visit | Attending: Surgery

## 2014-12-04 ENCOUNTER — Ambulatory Visit (HOSPITAL_COMMUNITY): Payer: 59 | Admitting: Anesthesiology

## 2014-12-04 ENCOUNTER — Encounter (HOSPITAL_COMMUNITY): Payer: Self-pay | Admitting: *Deleted

## 2014-12-04 ENCOUNTER — Ambulatory Visit (HOSPITAL_COMMUNITY)
Admission: RE | Admit: 2014-12-04 | Discharge: 2014-12-04 | Disposition: A | Discharge status: Home / Self Care | Payer: 59 | Source: Ambulatory Visit | Attending: Surgery | Admitting: Surgery

## 2014-12-04 DIAGNOSIS — Z79899 Other long term (current) drug therapy: Secondary | ICD-10-CM | POA: Diagnosis not present

## 2014-12-04 DIAGNOSIS — K660 Peritoneal adhesions (postprocedural) (postinfection): Secondary | ICD-10-CM | POA: Diagnosis not present

## 2014-12-04 DIAGNOSIS — K8010 Calculus of gallbladder with chronic cholecystitis without obstruction: Secondary | ICD-10-CM | POA: Diagnosis not present

## 2014-12-04 DIAGNOSIS — Z641 Problems related to multiparity: Secondary | ICD-10-CM | POA: Insufficient documentation

## 2014-12-04 DIAGNOSIS — K8050 Calculus of bile duct without cholangitis or cholecystitis without obstruction: Secondary | ICD-10-CM | POA: Diagnosis present

## 2014-12-04 DIAGNOSIS — K760 Fatty (change of) liver, not elsewhere classified: Secondary | ICD-10-CM | POA: Insufficient documentation

## 2014-12-04 HISTORY — PX: LAPAROSCOPIC CHOLECYSTECTOMY SINGLE SITE WITH INTRAOPERATIVE CHOLANGIOGRAM: SHX6538

## 2014-12-04 SURGERY — LAPAROSCOPIC CHOLECYSTECTOMY SINGLE SITE WITH INTRAOPERATIVE CHOLANGIOGRAM
Anesthesia: General | Site: Abdomen

## 2014-12-04 MED ORDER — CEFAZOLIN SODIUM-DEXTROSE 2-3 GM-% IV SOLR
INTRAVENOUS | Status: DC | PRN
  Administered 2014-12-04: 2 g via INTRAVENOUS

## 2014-12-04 MED ORDER — PROMETHAZINE HCL 25 MG/ML IJ SOLN: 6.2500 mg | INTRAMUSCULAR | Status: DC | PRN

## 2014-12-04 MED ORDER — LACTATED RINGERS IV SOLN
INTRAVENOUS | Status: DC | PRN
  Administered 2014-12-04 (×2): via INTRAVENOUS

## 2014-12-04 MED ORDER — OXYCODONE HCL 5 MG PO TABS: 5.0000 mg | ORAL_TABLET | Freq: Once | ORAL | Status: DC | PRN

## 2014-12-04 MED ORDER — BUPIVACAINE-EPINEPHRINE 0.25% -1:200000 IJ SOLN
INTRAMUSCULAR | Status: DC | PRN
  Administered 2014-12-04: 60 mL

## 2014-12-04 MED ORDER — CISATRACURIUM BESYLATE 20 MG/10ML IV SOLN
INTRAVENOUS | Status: AC
  Filled 2014-12-04: qty 10

## 2014-12-04 MED ORDER — GLYCOPYRROLATE 0.2 MG/ML IJ SOLN
INTRAMUSCULAR | Status: DC | PRN
  Administered 2014-12-04: 0.4 mg via INTRAVENOUS

## 2014-12-04 MED ORDER — MIDAZOLAM HCL 5 MG/5ML IJ SOLN
INTRAMUSCULAR | Status: DC | PRN
  Administered 2014-12-04: 2 mg via INTRAVENOUS

## 2014-12-04 MED ORDER — ONDANSETRON HCL 4 MG/2ML IJ SOLN
INTRAMUSCULAR | Status: AC
  Filled 2014-12-04: qty 2

## 2014-12-04 MED ORDER — PROPOFOL 10 MG/ML IV BOLUS
INTRAVENOUS | Status: DC | PRN
  Administered 2014-12-04: 160 mg via INTRAVENOUS
  Administered 2014-12-04: 40 mg via INTRAVENOUS

## 2014-12-04 MED ORDER — OXYCODONE HCL 5 MG/5ML PO SOLN: 5.0000 mg | Freq: Once | ORAL | Status: DC | PRN

## 2014-12-04 MED ORDER — HYDROMORPHONE HCL 1 MG/ML IJ SOLN
0.2500 mg | INTRAMUSCULAR | Status: DC | PRN
  Administered 2014-12-04 (×2): 0.5 mg via INTRAVENOUS

## 2014-12-04 MED ORDER — KETOROLAC TROMETHAMINE 30 MG/ML IJ SOLN
INTRAMUSCULAR | Status: DC | PRN
  Administered 2014-12-04: 30 mg via INTRAVENOUS

## 2014-12-04 MED ORDER — LIDOCAINE HCL 1 % IJ SOLN
INTRAMUSCULAR | Status: DC | PRN
  Administered 2014-12-04: 80 mg via INTRADERMAL

## 2014-12-04 MED ORDER — PROPOFOL 10 MG/ML IV BOLUS
INTRAVENOUS | Status: AC
  Filled 2014-12-04: qty 20

## 2014-12-04 MED ORDER — CHLORHEXIDINE GLUCONATE 4 % EX LIQD: 1.0000 "application " | Freq: Once | CUTANEOUS | Status: DC

## 2014-12-04 MED ORDER — HYDROCODONE-ACETAMINOPHEN 5-325 MG PO TABS: 1.0000 | ORAL_TABLET | Freq: Four times a day (QID) | ORAL | Status: DC | PRN

## 2014-12-04 MED ORDER — CISATRACURIUM BESYLATE (PF) 10 MG/5ML IV SOLN
INTRAVENOUS | Status: DC | PRN
  Administered 2014-12-04: 2 mg via INTRAVENOUS
  Administered 2014-12-04: 6 mg via INTRAVENOUS

## 2014-12-04 MED ORDER — NEOSTIGMINE METHYLSULFATE 10 MG/10ML IV SOLN
INTRAVENOUS | Status: AC
  Filled 2014-12-04: qty 1

## 2014-12-04 MED ORDER — ONDANSETRON HCL 4 MG/2ML IJ SOLN
INTRAMUSCULAR | Status: DC | PRN
  Administered 2014-12-04: 4 mg via INTRAVENOUS

## 2014-12-04 MED ORDER — NEOSTIGMINE METHYLSULFATE 10 MG/10ML IV SOLN
INTRAVENOUS | Status: DC | PRN
  Administered 2014-12-04: 4 mg via INTRAVENOUS

## 2014-12-04 MED ORDER — FENTANYL CITRATE 0.05 MG/ML IJ SOLN
INTRAMUSCULAR | Status: DC | PRN
  Administered 2014-12-04: 100 ug via INTRAVENOUS
  Administered 2014-12-04: 50 ug via INTRAVENOUS
  Administered 2014-12-04: 100 ug via INTRAVENOUS

## 2014-12-04 MED ORDER — BUPIVACAINE-EPINEPHRINE (PF) 0.25% -1:200000 IJ SOLN
INTRAMUSCULAR | Status: AC
  Filled 2014-12-04: qty 60

## 2014-12-04 MED ORDER — GLYCOPYRROLATE 0.2 MG/ML IJ SOLN
INTRAMUSCULAR | Status: AC
  Filled 2014-12-04: qty 2

## 2014-12-04 MED ORDER — LIDOCAINE HCL (CARDIAC) 20 MG/ML IV SOLN
INTRAVENOUS | Status: AC
Start: 2014-12-04 — End: 2014-12-04
  Filled 2014-12-04: qty 5

## 2014-12-04 MED ORDER — HYDROMORPHONE HCL 1 MG/ML IJ SOLN
INTRAMUSCULAR | Status: AC
  Filled 2014-12-04: qty 1

## 2014-12-04 MED ORDER — LACTATED RINGERS IV SOLN
INTRAVENOUS | Status: DC
  Administered 2014-12-04: 1000 mL via INTRAVENOUS
  Administered 2014-12-04: 15:00:00 via INTRAVENOUS

## 2014-12-04 MED ORDER — FENTANYL CITRATE 0.05 MG/ML IJ SOLN
INTRAMUSCULAR | Status: AC
  Filled 2014-12-04: qty 5

## 2014-12-04 MED ORDER — SUCCINYLCHOLINE CHLORIDE 20 MG/ML IJ SOLN
INTRAMUSCULAR | Status: DC | PRN
  Administered 2014-12-04: 100 mg via INTRAVENOUS

## 2014-12-04 MED ORDER — MIDAZOLAM HCL 2 MG/2ML IJ SOLN
INTRAMUSCULAR | Status: AC
  Filled 2014-12-04: qty 2

## 2014-12-04 MED ORDER — CEFAZOLIN SODIUM-DEXTROSE 2-3 GM-% IV SOLR
INTRAVENOUS | Status: AC
  Filled 2014-12-04: qty 50

## 2014-12-04 SURGICAL SUPPLY — 37 items
APPLIER CLIP 5 13 M/L LIGAMAX5 (MISCELLANEOUS) ×2
CABLE HIGH FREQUENCY MONO STRZ (ELECTRODE) ×2 IMPLANT
CHLORAPREP W/TINT 26ML (MISCELLANEOUS) ×2 IMPLANT
CLIP APPLIE 5 13 M/L LIGAMAX5 (MISCELLANEOUS) ×1 IMPLANT
COVER MAYO STAND STRL (DRAPES) ×2 IMPLANT
DECANTER SPIKE VIAL GLASS SM (MISCELLANEOUS) ×2 IMPLANT
DRAIN CHANNEL 19F RND (DRAIN) IMPLANT
DRAPE C-ARM 42X120 X-RAY (DRAPES) ×2 IMPLANT
DRAPE LAPAROSCOPIC ABDOMINAL (DRAPES) ×2 IMPLANT
DRAPE UTILITY XL STRL (DRAPES) ×2 IMPLANT
DRAPE WARM FLUID 44X44 (DRAPE) ×2 IMPLANT
DRSG TEGADERM 4X4.75 (GAUZE/BANDAGES/DRESSINGS) ×2 IMPLANT
ELECT REM PT RETURN 9FT ADLT (ELECTROSURGICAL) ×2
ELECTRODE REM PT RTRN 9FT ADLT (ELECTROSURGICAL) ×1 IMPLANT
ENDOLOOP SUT PDS II  0 18 (SUTURE) ×1
ENDOLOOP SUT PDS II 0 18 (SUTURE) ×1 IMPLANT
EVACUATOR SILICONE 100CC (DRAIN) IMPLANT
GAUZE SPONGE 2X2 8PLY STRL LF (GAUZE/BANDAGES/DRESSINGS) ×1 IMPLANT
GLOVE ECLIPSE 8.0 STRL XLNG CF (GLOVE) ×2 IMPLANT
GLOVE INDICATOR 8.0 STRL GRN (GLOVE) ×2 IMPLANT
GOWN STRL REUS W/TWL XL LVL3 (GOWN DISPOSABLE) ×4 IMPLANT
KIT BASIN OR (CUSTOM PROCEDURE TRAY) ×2 IMPLANT
NS IRRIG 1000ML POUR BTL (IV SOLUTION) ×2 IMPLANT
POUCH SPECIMEN RETRIEVAL 10MM (ENDOMECHANICALS) ×2 IMPLANT
SCISSORS LAP 5X35 DISP (ENDOMECHANICALS) ×2 IMPLANT
SET CHOLANGIOGRAPH MIX (MISCELLANEOUS) ×2 IMPLANT
SET IRRIG TUBING LAPAROSCOPIC (IRRIGATION / IRRIGATOR) ×2 IMPLANT
SHEARS HARMONIC ACE PLUS 36CM (ENDOMECHANICALS) ×2 IMPLANT
SPONGE GAUZE 2X2 STER 10/PKG (GAUZE/BANDAGES/DRESSINGS) ×1
SUT MNCRL AB 4-0 PS2 18 (SUTURE) ×2 IMPLANT
SUT PDS AB 0 CT 36 (SUTURE) ×4 IMPLANT
SYR 20CC LL (SYRINGE) ×2 IMPLANT
TOWEL OR 17X26 10 PK STRL BLUE (TOWEL DISPOSABLE) ×2 IMPLANT
TOWEL OR NON WOVEN STRL DISP B (DISPOSABLE) ×2 IMPLANT
TRAY LAPAROSCOPIC (CUSTOM PROCEDURE TRAY) ×2 IMPLANT
TROCAR BLADELESS OPT 5 100 (ENDOMECHANICALS) ×2 IMPLANT
TROCAR BLADELESS OPT 5 150 (ENDOMECHANICALS) ×2 IMPLANT

## 2014-12-04 NOTE — Op Note (Addendum)
12/04/2014  12:36 PM  PATIENT:  Suzanne Ramirez  34 y.o. female  Patient Care Team: Ralene Ok, MD as PCP - General (Internal Medicine)  PRE-OPERATIVE DIAGNOSIS:  Biliary Colic  POST-OPERATIVE DIAGNOSIS:    Biliary Colic Chronic Cholecystitis Fatty change in the liver  PROCEDURE:  Procedure(s): LAPAROSCOPIC CHOLECYSTECTOMY SINGLE SITE   SURGEON:  Surgeon(s): Karie Soda, MD  ASSISTANT: RN   ANESTHESIA:   local and general  EBL:  Total I/O In: 1000 [I.V.:1000] Out: -   Delay start of Pharmacological VTE agent (>24hrs) due to surgical blood loss or risk of bleeding:  no  DRAINS: none   SPECIMEN:  Source of Specimen:   Gallbladder   DISPOSITION OF SPECIMEN:  PATHOLOGY  COUNTS:  YES  PLAN OF CARE: Discharge to home after PACU  PATIENT DISPOSITION:  PACU - hemodynamically stable.  INDICATION: Patient with recurrent symptoms of biliary colic & gallstones.    The anatomy & physiology of hepatobiliary & pancreatic function was discussed.  The pathophysiology of gallbladder dysfunction was discussed.  Natural history risks without surgery was discussed.   I feel the risks of no intervention will lead to serious problems that outweigh the operative risks; therefore, I recommended cholecystectomy to remove the pathology.  I explained laparoscopic techniques with possible need for an open approach.  Probable cholangiogram to evaluate the bilary tract was explained as well.    Risks such as bleeding, infection, abscess, leak, injury to other organs, need for further treatment, heart attack, death, and other risks were discussed.  I noted a good likelihood this will help address the problem.  Possibility that this will not correct all abdominal symptoms was explained.  Goals of post-operative recovery were discussed as well.  We will work to minimize complications.  An educational handout further explaining the pathology and treatment options was given as well.  Questions were  answered.  The patient expresses understanding & wishes to proceed with surgery.  OR FINDINGS: Dilated boggy gallbladder chock-full of stones.  Chronic cholecystitis.  Fatty change in the liver  DESCRIPTION:   The patient was identified & brought in the operating room. The patient was positioned supine with arms tucked. SCDs were active during the entire case. The patient underwent general anesthesia without any difficulty.  The abdomen was prepped and draped in a sterile fashion. A Surgical Timeout confirmed our plan.  I made a transverse curvilinear incision through the superior umbilical fold.  I placed a 5mm long port through the supraumbilical fascia using a modified Hassan cutdown technique. I began carbon dioxide insufflation. Camera inspection revealed no injury. There were no adhesions to the anterior abdominal wall supraumbilically.  I proceeded to continue with single site technique. I placed a #5 port in left upper aspect of the wound. I placed a 5 mm atraumatic grasper in the right inferior aspect of the wound.  I turned attention to the right upper quadrant.  Omental & duodenal adhesions released.  The gallbladder fundus was elevated cephalad. I freed the peritoneal coverings between the gallbladder and the liver on the posteriolateral and anteriomedial walls. I alternated between Harmonic & blunt Maryland dissection to help get a good critical view of the cystic artery and cystic duct. I did further dissection to free a few centimeters of the  gallbladder off the liver bed to get a good critical view of the infundibulum and cystic duct. I mobilized the cystic artery; and, after getting a good 360 view, ligated the cystic artery using the  Harmonic ultrasonic dissection. I skeletonized the cystic duct.  I placed a clip on the infundibulum. I did a partial cystic duct-otomy and ensured patency. I placed a 5 Jamaica[place] cholangiocatheter through a puncture site at the right subcostal ridge of the  abdominal wall and directed it into the cystic duct.  We ran a cholangiogram with dilute radio-opaque contrast and continuous fluoroscopy.  Contrast was leaking out from the cystic duct.  I removed the catheter as it had partially fell out & confirmed good critical view.  No injury to cystic duct.   I removed the cholangiocatheter.  In doing this the gallbladder came off the cystic duct.  She did have a 3cm cystic duct stump.  I ligated it with 0 PDS Endoloop. I placed clips on the cystic duct x4.  I freed the gallbladder from its remaining attachments to the liver. I ensured hemostasis on the gallbladder fossa of the liver and elsewhere. I inspected the rest of the abdomen & detected no injury nor bleeding elsewhere.  I removed the gallbladder out the supraumbilical fascia - had to dilate to 3 cm to get the thickened GB with ###stoines out. I closed the fascia transversely using 0 PDS interrupted stitches. A closed the skin using 4-0 monocryl stitch.  Sterile dressing was applied. The patient was extubated & arrived in the PACU in stable condition..  I had discussed postoperative care with the patient in the holding area. I updated the status of the patient to the patient's husband and family.  I made recommendations.  I answered questions.  Understanding & appreciation was expressed.  I did discuss operative findings and postoperative goals / instructions.  Instructions are written in the chart as well.  Ardeth SportsmanSteven C. Elpidio Thielen, M.D., F.A.C.S. Gastrointestinal and Minimally Invasive Surgery [facility] [address] [place], [place] [id number] [phone] Main / Paging

## 2014-12-04 NOTE — H&P (Addendum)
Keylee Spieth  Location: [facility] Patient #: [id number] DOB: [birth year] Married / Language: English / Race: Black or African American Female  History of Present Illness Patient words: gallbladder.  The patient is a 34 year old female who presents for evaluation of gall stones. Patient sent back from her primary care physician, Dr. Ralene Okoy Moreira, for concerns of persistent symptomatic gallstones. Pleasant woman originally from [place]. Had classic story for biliary colic. I saw her in 2013. I recommended cholecystectomy. She found out she was pregnant right after surgery was scheduled. Therefore we delayed. Had baby delivered January 2014. She intermittently travelled back to [place] especially 2014. She has been to the ER intermittently for the past year or so with attacks. Recommendation was made to reconsider surgery. Apparently appointment fell through last May. She had worsening attacks. Went to the emergency room again this month. Again recommended to have surgery. She comes today with her husband. She notes she gets upper abdominal pain that radiates to the right side and back and shoulder. Full-blooded nauseated. Usually triggered by eating. She is tried to transition to a more bland diet. Even oatmeal trigger it now. She denies heartburn or reflux. Normally moderately active. No abdominal surgeries. She does not smoke. No history of irritable bowel syndrome, inflammatory bowel disease, etc. No major bleeding. No hematemesis.   No improvement with over-the-counter medications. She is now ready to consider surgery.  No new events   Other Problems Gilmer Mor(Sonya Bynum, CMA; 10/31/2014 9:57 AM) No pertinent past medical history  Past Surgical History Gilmer Mor(Sonya Bynum, CMA; 10/31/2014 9:57 AM) No pertinent past surgical history  Diagnostic Studies History Gilmer Mor(Sonya Bynum, CMA; 10/31/2014 9:57 AM) Colonoscopy never Mammogram never Pap Smear never  Allergies  (Sonya Bynum, CMA; 10/31/2014 9:58 AM) No Known Drug Allergies12/16/2015  Medication History (Sonya Bynum, CMA; 10/31/2014 9:58 AM) Hydrocodone-Acetaminophen (5-325MG  Tablet, Oral as needed) Active.  Social History Gilmer Mor(Sonya Bynum, CMA; 10/31/2014 9:57 AM) No alcohol use No drug use Tobacco use Never smoker.  Family History Gilmer Mor(Sonya Bynum, CMA; 10/31/2014 9:57 AM) First Degree Relatives No pertinent family history  Pregnancy / Birth History Gilmer Mor(Sonya Bynum, CMA; 10/31/2014 9:57 AM) Age at menarche 13 years. Gravida 3 Para 3 Regular periods  Review of Systems (Sonya Bynum CMA; 10/31/2014 9:57 AM) General Not Present- Appetite Loss, Chills, Fatigue, Fever, Night Sweats, Weight Gain and Weight Loss. Skin Not Present- Change in Wart/Mole, Dryness, Hives, Jaundice, New Lesions, Non-Healing Wounds, Rash and Ulcer. HEENT Not Present- Earache, Hearing Loss, Hoarseness, Nose Bleed, Oral Ulcers, Ringing in the Ears, Seasonal Allergies, Sinus Pain, Sore Throat, Visual Disturbances, Wears glasses/contact lenses and Yellow Eyes. Respiratory Not Present- Bloody sputum, Chronic Cough, Difficulty Breathing, Snoring and Wheezing. Breast Not Present- Breast Mass, Breast Pain, Nipple Discharge and Skin Changes. Cardiovascular Not Present- Chest Pain, Difficulty Breathing Lying Down, Leg Cramps, Palpitations, Rapid Heart Rate, Shortness of Breath and Swelling of Extremities. Gastrointestinal Present- Change in Bowel Habits, Constipation and Excessive gas. Not Present- Abdominal Pain, Bloating, Bloody Stool, Chronic diarrhea, Difficulty Swallowing, Gets full quickly at meals, Hemorrhoids, Indigestion, Nausea, Rectal Pain and Vomiting. Female Genitourinary Not Present- Frequency, Nocturia, Painful Urination, Pelvic Pain and Urgency. Neurological Not Present- Decreased Memory, Fainting, Headaches, Numbness, Seizures, Tingling, Tremor, Trouble walking and Weakness. Psychiatric Not Present- Anxiety,  Bipolar, Change in Sleep Pattern, Depression, Fearful and Frequent crying. Endocrine Not Present- Cold Intolerance, Excessive Hunger, Hair Changes, Heat Intolerance, Hot flashes and New Diabetes. Hematology Not Present- Easy Bruising, Excessive bleeding, Gland problems, HIV and Persistent Infections.  Vitals (Sonya Bynum CMA; 10/31/2014 9:58 AM) 10/31/2014 9:57 AM Weight: 163 lb Height: 64in Body Surface Area: 1.83 m Body Mass Index: 27.98 kg/m Temp.: 22F(Temporal)  Pulse: 73 (Regular)  BP: 122/78 (Sitting, Left Arm, Standard)    Physical Exam [name] MD; 10/31/2014 10:41 AM) General Mental Status-Alert. General Appearance-Not in acute distress, Not Sickly. Orientation-Oriented X3. Hydration-Well hydrated. Voice-Normal.  Integumentary Global Assessment Upon inspection and palpation of skin surfaces of the - Axillae: non-tender, no inflammation or ulceration, no drainage. and Distribution of scalp and body hair is normal. General Characteristics Temperature - normal warmth is noted.  Head and Neck Head-normocephalic, atraumatic with no lesions or palpable masses. Face Global Assessment - atraumatic, no absence of expression. Neck Global Assessment - no abnormal movements, no bruit auscultated on the right, no bruit auscultated on the left, no decreased range of motion, non-tender. Trachea-midline. Thyroid Gland Characteristics - non-tender.  Eye Eyeball - Left-Extraocular movements intact, No Nystagmus. Eyeball - Right-Extraocular movements intact, No Nystagmus. Cornea - Left-No Hazy. Cornea - Right-No Hazy. Sclera/Conjunctiva - Left-No scleral icterus, No Discharge. Sclera/Conjunctiva - Right-No scleral icterus, No Discharge. Pupil - Left-Direct reaction to light normal. Pupil - Right-Direct reaction to light normal.  ENMT Ears Pinna - Left - no drainage observed, no generalized tenderness observed. Right - no  drainage observed, no generalized tenderness observed. Nose and Sinuses External Inspection of the Nose - no destructive lesion observed. Inspection of the nares - Left - quiet respiration. Right - quiet respiration. Mouth and Throat Lips - Upper Lip - no fissures observed, no pallor noted. Lower Lip - no fissures observed, no pallor noted. Nasopharynx - no discharge present. Oral Cavity/Oropharynx - Tongue - no dryness observed. Oral Mucosa - no cyanosis observed. Hypopharynx - no evidence of airway distress observed.  Chest and Lung Exam Inspection Movements - Normal and Symmetrical. Accessory muscles - No use of accessory muscles in breathing. Palpation Palpation of the chest reveals - Non-tender. Auscultation Breath sounds - Normal and Clear.  Cardiovascular Auscultation Rhythm - Regular. Murmurs & Other Heart Sounds - Auscultation of the heart reveals - No Murmurs and No Systolic Clicks.  Abdomen Inspection Inspection of the abdomen reveals - No Visible peristalsis and No Abnormal pulsations. Umbilicus - No Bleeding, No Urine drainage. Palpation/Percussion Palpation and Percussion of the abdomen reveal - Soft, Non Tender, No Rebound tenderness, No Rigidity (guarding) and No Cutaneous hyperesthesia. Note: Mild discomfort in right upper quadrant. No guarding. Abdomen somewhat elastic consistent with postpartum state. No major diastases. No umbilical hernia.   Female Genitourinary Sexual Maturity Tanner 5 - Adult hair pattern. Note: No vaginal bleeding nor discharge   Peripheral Vascular Upper Extremity Inspection - Left - No Cyanotic nailbeds, Not Ischemic. Right - No Cyanotic nailbeds, Not Ischemic.  Neurologic Neurologic evaluation reveals -normal attention span and ability to concentrate, able to name objects and repeat phrases. Appropriate fund of knowledge , normal sensation and normal coordination. Mental Status Affect - not angry, not paranoid. Cranial  Nerves-Normal Bilaterally. Gait-Normal.  Neuropsychiatric Mental status exam performed with findings of-able to articulate well with normal speech/language, rate, volume and coherence, thought content normal with ability to perform basic computations and apply abstract reasoning and no evidence of hallucinations, delusions, obsessions or homicidal/suicidal ideation.  Musculoskeletal Global Assessment Spine, Ribs and Pelvis - no instability, subluxation or laxity. Right Upper Extremity - no instability, subluxation or laxity.  Lymphatic Head & Neck  General Head & Neck Lymphatics: Bilateral - Description - No Localized lymphadenopathy.  Axillary  General Axillary Region: Bilateral - Description - No Localized lymphadenopathy. Femoral & Inguinal  Generalized Femoral & Inguinal Lymphatics: Left - Description - No Localized lymphadenopathy. Right - Description - No Localized lymphadenopathy.    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 Had recommended cholecystectomy in 2013. A pregnancy and then travel to [place] delayed things for a year or so. She is back in the country. She has persistent attacks. She is ready to consider surgery. I discussed at length with the patient and her husband. They wish to proceed. Reasonable single site laparoscopic candidate  The anatomy & physiology of hepatobiliary & pancreatic function was discussed.  The pathophysiology of gallbladder dysfunction was discussed.  Natural history risks without surgery was discussed.   I feel the risks of no intervention will lead to serious problems that outweigh the operative risks; therefore, I recommended cholecystectomy to remove the pathology.  I explained laparoscopic techniques with possible need for an open approach.  Probable cholangiogram to evaluate the bilary tract was explained as well.    Risks such as bleeding, infection,  abscess, leak, injury to other organs, need for further treatment, stroke, heart attack, death, and other risks were discussed.  I noted a good likelihood this will help address the problem.  Possibility that this will not correct all abdominal symptoms was explained.  Goals of post-operative recovery were discussed as well.  We will work to minimize complications.  An educational handout further explaining the pathology and treatment options was given as well.  Questions were answered.  The patient expresses understanding & wishes to proceed with surgery.   Current Plans  Schedule for Surgery Pt Education - [facility] Laparosopic Post Op HCI (Sol Englert) Pt Education - [facility] Pain Control (Kynslie Ringle) Pt Education - [facility] Good Bowel Health (Joylynn Defrancesco) Pt Education - Cholecystitis: cholecystitis  [name], M.D., F.A.C.S. Gastrointestinal and Minimally Invasive Surgery [facility] [address], [address] [place], [place] [address] [phone] Main / Paging

## 2014-12-04 NOTE — Anesthesia Postprocedure Evaluation (Signed)
Anesthesia Post Note  Patient: Suzanne Ramirez  Procedure(s) Performed: Procedure(s) (LRB): LAPAROSCOPIC CHOLECYSTECTOMY SINGLE SITE WITH INTRAOPERATIVE CHOLANGIOGRAM (N/A)  Anesthesia type: general  Patient location: PACU  Post pain: Pain level controlled  Post assessment: Patient's Cardiovascular Status Stable  Last Vitals:  Filed Vitals:   12/04/14 1353  BP: 116/62  Pulse: 65  Temp: 36.5 C  Resp: 14    Post vital signs: Reviewed and stable  Level of consciousness: sedated  Complications: No apparent anesthesia complications

## 2014-12-04 NOTE — Transfer of Care (Signed)
Immediate Anesthesia Transfer of Care Note  Patient: Suzanne Ramirez  Procedure(s) Performed: Procedure(s): LAPAROSCOPIC CHOLECYSTECTOMY SINGLE SITE WITH INTRAOPERATIVE CHOLANGIOGRAM (N/A)  Patient Location: PACU  Anesthesia Type:General  Level of Consciousness: awake, alert  and oriented  Airway & Oxygen Therapy: Patient Spontanous Breathing and Patient connected to face mask oxygen  Post-op Assessment: Report given to PACU RN  Post vital signs: Reviewed and stable  Complications: No apparent anesthesia complications

## 2014-12-04 NOTE — Progress Notes (Signed)
Continues w waves of nausea

## 2014-12-04 NOTE — Anesthesia Preprocedure Evaluation (Signed)
Anesthesia Evaluation  Patient identified by MRN, date of birth, ID band Patient awake    Reviewed: Allergy & Precautions, H&P , NPO status , Patient's Chart, lab work & pertinent test results  Airway        Dental   Pulmonary neg pulmonary ROS,          Cardiovascular negative cardio ROS      Neuro/Psych negative neurological ROS  negative psych ROS   GI/Hepatic Neg liver ROS,   Endo/Other  negative endocrine ROS  Renal/GU negative Renal ROS     Musculoskeletal   Abdominal   Peds  Hematology   Anesthesia Other Findings   Reproductive/Obstetrics negative OB ROS                             Anesthesia Physical Anesthesia Plan  ASA: I  Anesthesia Plan: General ETT   Post-op Pain Management:    Induction:   Airway Management Planned:   Additional Equipment:   Intra-op Plan:   Post-operative Plan:   Informed Consent:   Plan Discussed with:   Anesthesia Plan Comments:         Anesthesia Quick Evaluation

## 2014-12-04 NOTE — Interval H&P Note (Signed)
History and Physical Interval Note:  12/04/2014 11:01 AM  Suzanne Ramirez  has presented today for surgery, with the diagnosis of Biliary Colic  The various methods of treatment have been discussed with the patient and family. After consideration of risks, benefits and other options for treatment, the patient has consented to  Procedure(s): LAPAROSCOPIC CHOLECYSTECTOMY SINGLE SITE WITH INTRAOPERATIVE CHOLANGIOGRAM (N/A) as a surgical intervention .  The patient's history has been reviewed, patient examined, no change in status, stable for surgery.  I have reviewed the patient's chart and labs.  Questions were answered to the patient's satisfaction.     Elise Gladden C.

## 2014-12-04 NOTE — Discharge Instructions (Signed)
LAPAROSCOPIC SURGERY: POST OP INSTRUCTIONS ° °1. DIET: Follow a light bland diet the first 24 hours after arrival home, such as soup, liquids, crackers, etc.  Be sure to include lots of fluids daily.  Avoid fast food or heavy meals as your are more likely to get nauseated.  Eat a low fat the next few days after surgery.   °2. Take your usually prescribed home medications unless otherwise directed. °3. PAIN CONTROL: °a. Pain is best controlled by a usual combination of three different methods TOGETHER: °i. Ice/Heat °ii. Over the counter pain medication °iii. Prescription pain medication °b. Most patients will experience some swelling and bruising around the incisions.  Ice packs or heating pads (30-60 minutes up to 6 times a day) will help. Use ice for the first few days to help decrease swelling and bruising, then switch to heat to help relax tight/sore spots and speed recovery.  Some people prefer to use ice alone, heat alone, alternating between ice & heat.  Experiment to what works for you.  Swelling and bruising can take several weeks to resolve.   °c. It is helpful to take an over-the-counter pain medication regularly for the first few weeks.  Choose one of the following that works best for you: °i. Naproxen (Aleve, etc)  Two 220mg tabs twice a day °ii. Ibuprofen (Advil, etc) Three 200mg tabs four times a day (every meal & bedtime) °iii. Acetaminophen (Tylenol, etc) 500-650mg four times a day (every meal & bedtime) °d. A  prescription for pain medication (such as oxycodone, hydrocodone, etc) should be given to you upon discharge.  Take your pain medication as prescribed.  °i. If you are having problems/concerns with the prescription medicine (does not control pain, nausea, vomiting, rash, itching, etc), please call us [phone] to see if we need to switch you to a different pain medicine that will work better for you and/or control your side effect better. °ii. If you need a refill on your pain medication,  please contact your pharmacy.  They will contact our office to request authorization. Prescriptions will not be filled after 5 pm or on week-ends. °4. Avoid getting constipated.  Between the surgery and the pain medications, it is common to experience some constipation.  Increasing fluid intake and taking a fiber supplement (such as Metamucil, Citrucel, FiberCon, MiraLax, etc) 1-2 times a day regularly will usually help prevent this problem from occurring.  A mild laxative (prune juice, Milk of Magnesia, MiraLax, etc) should be taken according to package directions if there are no bowel movements after 48 hours.   °5. Watch out for diarrhea.  If you have many loose bowel movements, simplify your diet to bland foods & liquids for a few days.  Stop any stool softeners and decrease your fiber supplement.  Switching to mild anti-diarrheal medications (Kayopectate, Pepto Bismol) can help.  If this worsens or does not improve, please call us. °6. Wash / shower every day.  You may shower over the dressings as they are waterproof.  Continue to shower over incision(s) after the dressing is off. °7. Remove your waterproof bandages 5 days after surgery.  You may leave the incision open to air.  You may replace a dressing/Band-Aid to cover the incision for comfort if you wish.  °8. ACTIVITIES as tolerated:   °a. You may resume regular (light) daily activities beginning the next day--such as daily self-care, walking, climbing stairs--gradually increasing activities as tolerated.  If you can walk 30 minutes without difficulty, it   is safe to try more intense activity such as jogging, treadmill, bicycling, low-impact aerobics, swimming, etc. °b. Save the most intensive and strenuous activity for last such as sit-ups, heavy lifting, contact sports, etc  Refrain from any heavy lifting or straining until you are off narcotics for pain control.   °c. DO NOT PUSH THROUGH PAIN.  Let pain be your guide: If it hurts to do something, don't  do it.  Pain is your body warning you to avoid that activity for another week until the pain goes down. °d. You may drive when you are no longer taking prescription pain medication, you can comfortably wear a seatbelt, and you can safely maneuver your car and apply brakes. °e. You may have sexual intercourse when it is comfortable.  °9. FOLLOW UP in our office °a. Please call [facility] at [phone] to set up an appointment to see your surgeon in the office for a follow-up appointment approximately 2-3 weeks after your surgery. °b. Make sure that you call for this appointment the day you arrive home to insure a convenient appointment time. °10. IF YOU HAVE DISABILITY OR FAMILY LEAVE FORMS, BRING THEM TO THE OFFICE FOR PROCESSING.  DO NOT GIVE THEM TO YOUR DOCTOR. ° ° °WHEN TO CALL US [phone]: °1. Poor pain control °2. Reactions / problems with new medications (rash/itching, nausea, etc)  °3. Fever over 101.5 F (38.5 C) °4. Inability to urinate °5. Nausea and/or vomiting °6. Worsening swelling or bruising °7. Continued bleeding from incision. °8. Increased pain, redness, or drainage from the incision ° ° The clinic staff is available to answer your questions during regular business hours (8:30am-5pm).  Please don’t hesitate to call and ask to speak to one of our nurses for clinical concerns.  ° If you have a medical emergency, go to the nearest emergency room or call 911. ° A surgeon from [facility] is always on call at the hospitals ° ° °[facility] °[address], [address], [place], [place]  [address] ? °MAIN: [phone] ? TOLL FREE: [phone] ?  °FAX [phone] °[email or web address] ° °Managing Pain ° °Pain after surgery or related to activity is often due to strain/injury to muscle, tendon, nerves and/or incisions.  This pain is usually short-term and will improve in a few months.  ° °Many people find it helpful to do the following things TOGETHER  to help speed the process of healing and to get back to regular activity more quickly: ° °1. Avoid heavy physical activity °a.  no lifting greater than 20 pounds °b. Do not “push through” the pain.  Listen to your body and avoid positions and maneuvers than reproduce the pain °c. Walking is okay as tolerated, but go slowly and stop when getting sore.  °d. Remember: If it hurts to do it, then don’t do it! °2. Take Anti-inflammatory medication  °a. Take with food/snack around the clock for 1-2 weeks °i. This helps the muscle and nerve tissues become less irritable and calm down faster °b. Choose ONE of the following over-the-counter medications: °i. Naproxen 220mg tabs (ex. Aleve) 1-2 pills twice a day  °ii. Ibuprofen 200mg tabs (ex. Advil, Motrin) 3-4 pills with every meal and just before bedtime °iii. Acetaminophen 500mg tabs (Tylenol) 1-2 pills with every meal and just before bedtime °3. Use a Heating pad or Ice/Cold Pack °a. 4-6 times a day °b. May use warm bath/hottub  or showers °4. Try Gentle Massage and/or Stretching  °a.   at the area of pain many times a day °b. stop if you feel pain - do not overdo it ° °Try these steps together to help you body heal faster and avoid making things get worse.  Doing just one of these things may not be enough.   ° °If you are not getting better after two weeks or are noticing you are getting worse, contact our office for further advice; we may need to re-evaluate you & see what other things we can do to help. ° °GETTING TO GOOD BOWEL HEALTH. °Irregular bowel habits such as constipation and diarrhea can lead to many problems over time.  Having one soft bowel movement a day is the most important way to prevent further problems.  The anorectal canal is designed to handle stretching and feces to safely manage our ability to get rid of solid waste (feces, poop, stool) out of our body.  BUT, hard constipated stools can act like ripping concrete bricks and diarrhea can be a burning  fire to this very sensitive area of our body, causing inflamed hemorrhoids, anal fissures, increasing risk is perirectal abscesses, abdominal pain/bloating, an making irritable bowel worse.     °The goal: ONE SOFT BOWEL MOVEMENT A DAY!  To have soft, regular bowel movements:  °• Drink at least 8 tall glasses of water a day.   °• Take plenty of fiber.  Fiber is the undigested part of plant food that passes into the colon, acting s “natures broom” to encourage bowel motility and movement.  Fiber can absorb and hold large amounts of water. This results in a larger, bulkier stool, which is soft and easier to pass. Work gradually over several weeks up to 6 servings a day of fiber (25g a day even more if needed) in the form of: °o Vegetables -- Root (potatoes, carrots, turnips), leafy green (lettuce, salad greens, celery, spinach), or cooked high residue (cabbage, broccoli, etc) °o Fruit -- Fresh (unpeeled skin & pulp), Dried (prunes, apricots, cherries, etc ),  or stewed ( applesauce)  °o Whole grain breads, pasta, etc (whole wheat)  °o Bran cereals  °• Bulking Agents -- This type of water-retaining fiber generally is easily obtained each day by one of the following:  °o Psyllium bran -- The psyllium plant is remarkable because its ground seeds can retain so much water. This product is available as Metamucil, Konsyl, Effersyllium, Per Diem Fiber, or the less expensive generic preparation in drug and health food stores. Although labeled a laxative, it really is not a laxative.  °o Methylcellulose -- This is another fiber derived from wood which also retains water. It is available as Citrucel. °o Polyethylene Glycol - and “artificial” fiber commonly called Miralax or Glycolax.  It is helpful for people with gassy or bloated feelings with regular fiber °o Flax Seed - a less gassy fiber than psyllium °• No reading or other relaxing activity while on the toilet. If bowel movements take longer than 5 minutes, you are too  constipated °• AVOID CONSTIPATION.  High fiber and water intake usually takes care of this.  Sometimes a laxative is needed to stimulate more frequent bowel movements, but  °• Laxatives are not a good long-term solution as it can wear the colon out. °o Osmotics (Milk of Magnesia, Fleets phosphosoda, Magnesium citrate, MiraLax, GoLytely) are safer than  °o Stimulants (Senokot, Castor Oil, Dulcolax, Ex Lax)    °o Do not take laxatives for more than 7days in a row. °•  IF   SEVERELY CONSTIPATED, try a Bowel Retraining Program: °o Do not use laxatives.  °o Eat a diet high in roughage, such as bran cereals and leafy vegetables.  °o Drink six (6) ounces of prune or apricot juice each morning.  °o Eat two (2) large servings of stewed fruit each day.  °o Take one (1) heaping tablespoon of a psyllium-based bulking agent twice a day. Use sugar-free sweetener when possible to avoid excessive calories.  °o Eat a normal breakfast.  °o Set aside 15 minutes after breakfast to sit on the toilet, but do not strain to have a bowel movement.  °o If you do not have a bowel movement by the third day, use an enema and repeat the above steps.  °• Controlling diarrhea °o Switch to liquids and simpler foods for a few days to avoid stressing your intestines further. °o Avoid dairy products (especially milk & ice cream) for a short time.  The intestines often can lose the ability to digest lactose when stressed. °o Avoid foods that cause gassiness or bloating.  Typical foods include beans and other legumes, cabbage, broccoli, and dairy foods.  Every person has some sensitivity to other foods, so listen to our body and avoid those foods that trigger problems for you. °o Adding fiber (Citrucel, Metamucil, psyllium, Miralax) gradually can help thicken stools by absorbing excess fluid and retrain the intestines to act more normally.  Slowly increase the dose over a few weeks.  Too much fiber too soon can backfire and cause cramping &  bloating. °o Probiotics (such as active yogurt, Align, etc) may help repopulate the intestines and colon with normal bacteria and calm down a sensitive digestive tract.  Most studies show it to be of mild help, though, and such products can be costly. °o Medicines: °- Bismuth subsalicylate (ex. Kayopectate, Pepto Bismol) every 30 minutes for up to 6 doses can help control diarrhea.  Avoid if pregnant. °- Loperamide (Immodium) can slow down diarrhea.  Start with two tablets (4mg total) first and then try one tablet every 6 hours.  Avoid if you are having fevers or severe pain.  If you are not better or start feeling worse, stop all medicines and call your doctor for advice °o Call your doctor if you are getting worse or not better.  Sometimes further testing (cultures, endoscopy, X-ray studies, bloodwork, etc) may be needed to help diagnose and treat the cause of the diarrhea. ° °Cholecystitis °Cholecystitis is an inflammation of your gallbladder. It is usually caused by a buildup of gallstones or sludge (cholelithiasis) in your gallbladder. The gallbladder stores a fluid that helps digest fats (bile). Cholecystitis is serious and needs treatment right away.  °CAUSES  °· Gallstones. Gallstones can block the tube that leads to your gallbladder, causing bile to build up. As bile builds up, the gallbladder becomes inflamed. °· Bile duct problems, such as blockage from scarring or kinking. °· Tumors. Tumors can stop bile from leaving your gallbladder correctly, causing bile to build up. As bile builds up, the gallbladder becomes inflamed. °SYMPTOMS  °· Nausea. °· Vomiting. °· Abdominal pain, especially in the upper right area of your abdomen. °· Abdominal tenderness or bloating. °· Sweating. °· Chills. °· Fever. °· Yellowing of the skin and the whites of the eyes (jaundice). °DIAGNOSIS  °Your caregiver may order blood tests to look for infection or gallbladder problems. Your caregiver may also order imaging tests, such  as an ultrasound or computed tomography (CT) scan. Further tests may include   a hepatobiliary iminodiacetic acid (HIDA) scan. This scan allows your caregiver to see your bile move from the liver to the gallbladder and to the small intestine. °TREATMENT  °A hospital stay is usually necessary to lessen the inflammation of your gallbladder. You may be required to not eat or drink (fast) for a certain amount of time. You may be given medicine to treat pain or an antibiotic medicine to treat an infection. Surgery may be needed to remove your gallbladder (cholecystectomy) once the inflammation has gone down. Surgery may be needed right away if you develop complications such as death of gallbladder tissue (gangrene) or a tear (perforation) of the gallbladder.  °HOME CARE INSTRUCTIONS  °Home care will depend on your treatment. In general: °· If you were given antibiotics, take them as directed. Finish them even if you start to feel better. °· Only take over-the-counter or prescription medicines for pain, discomfort, or fever as directed by your caregiver. °· Follow a low-fat diet until you see your caregiver again. °· Keep all follow-up visits as directed by your caregiver. °SEEK IMMEDIATE MEDICAL CARE IF:  °· Your pain is increasing and not controlled by medicines. °· Your pain moves to another part of your abdomen or to your back. °· You have a fever. °· You have nausea and vomiting. °MAKE SURE YOU: °· Understand these instructions. °· Will watch your condition. °· Will get help right away if you are not doing well or get worse. °Document Released: 11/02/2005 Document Revised: 01/25/2012 Document Reviewed: 09/18/2011 °Exit[employer]® Patient Information ©2015 [employer]. This information is not intended to replace advice given to you by your health care provider. Make sure you discuss any questions you have with your health care provider. ° °

## 2014-12-04 NOTE — Progress Notes (Signed)
Patient is having nausea w/o vomiting . Encouraged to just rest. And alcohol swab placed under nose

## 2014-12-05 ENCOUNTER — Encounter (HOSPITAL_COMMUNITY): Payer: Self-pay | Admitting: Surgery

## 2015-04-21 ENCOUNTER — Emergency Department (HOSPITAL_COMMUNITY): Payer: 59

## 2015-04-21 ENCOUNTER — Encounter (HOSPITAL_COMMUNITY): Payer: Self-pay | Admitting: *Deleted

## 2015-04-21 DIAGNOSIS — Z79899 Other long term (current) drug therapy: Secondary | ICD-10-CM | POA: Diagnosis not present

## 2015-04-21 DIAGNOSIS — Z3202 Encounter for pregnancy test, result negative: Secondary | ICD-10-CM | POA: Insufficient documentation

## 2015-04-21 DIAGNOSIS — R51 Headache: Secondary | ICD-10-CM | POA: Diagnosis not present

## 2015-04-21 DIAGNOSIS — R42 Dizziness and giddiness: Secondary | ICD-10-CM | POA: Diagnosis present

## 2015-04-21 DIAGNOSIS — Z8719 Personal history of other diseases of the digestive system: Secondary | ICD-10-CM | POA: Diagnosis not present

## 2015-04-21 DIAGNOSIS — D509 Iron deficiency anemia, unspecified: Secondary | ICD-10-CM | POA: Diagnosis not present

## 2015-04-21 LAB — POC URINE PREG, ED: Preg Test, Ur: NEGATIVE

## 2015-04-21 NOTE — ED Notes (Signed)
Patient presents stating she got up this morning and was feeling dizzy.  She laid back down, and felt like the dizziness went away.  Stated with a headache and took Tylenol and the dizziness subsuded some.

## 2015-04-22 ENCOUNTER — Emergency Department (HOSPITAL_COMMUNITY)
Admission: EM | Admit: 2015-04-22 | Discharge: 2015-04-22 | Disposition: A | Discharge status: Home / Self Care | Payer: 59 | Attending: Emergency Medicine | Admitting: Emergency Medicine

## 2015-04-22 DIAGNOSIS — R42 Dizziness and giddiness: Secondary | ICD-10-CM

## 2015-04-22 DIAGNOSIS — D509 Iron deficiency anemia, unspecified: Secondary | ICD-10-CM

## 2015-04-22 LAB — CBC WITH DIFFERENTIAL/PLATELET
Basophils Absolute: 0 10*3/uL (ref 0.0–0.1)
Basophils Relative: 0 % (ref 0–1)
Eosinophils Absolute: 0.1 10*3/uL (ref 0.0–0.7)
Eosinophils Relative: 2 % (ref 0–5)
HEMATOCRIT: 33.6 % — AB (ref 36.0–46.0)
Hemoglobin: 10.9 g/dL — ABNORMAL LOW (ref 12.0–15.0)
LYMPHS PCT: 55 % — AB (ref 12–46)
Lymphs Abs: 4.5 10*3/uL — ABNORMAL HIGH (ref 0.7–4.0)
MCH: 24.7 pg — ABNORMAL LOW (ref 26.0–34.0)
MCHC: 32.4 g/dL (ref 30.0–36.0)
MCV: 76.2 fL — AB (ref 78.0–100.0)
Monocytes Absolute: 0.6 10*3/uL (ref 0.1–1.0)
Monocytes Relative: 7 % (ref 3–12)
NEUTROS PCT: 36 % — AB (ref 43–77)
Neutro Abs: 3 10*3/uL (ref 1.7–7.7)
PLATELETS: 263 10*3/uL (ref 150–400)
RBC: 4.41 MIL/uL (ref 3.87–5.11)
RDW: 13.3 % (ref 11.5–15.5)
WBC: 8.2 10*3/uL (ref 4.0–10.5)

## 2015-04-22 LAB — COMPREHENSIVE METABOLIC PANEL
ALBUMIN: 3.8 g/dL (ref 3.5–5.0)
ALT: 14 U/L (ref 14–54)
AST: 21 U/L (ref 15–41)
Alkaline Phosphatase: 56 U/L (ref 38–126)
Anion gap: 8 (ref 5–15)
BUN: 10 mg/dL (ref 6–20)
CALCIUM: 9.2 mg/dL (ref 8.9–10.3)
CO2: 27 mmol/L (ref 22–32)
CREATININE: 0.63 mg/dL (ref 0.44–1.00)
Chloride: 102 mmol/L (ref 101–111)
GFR calc Af Amer: >60 mL/min (ref >60)
Glucose, Bld: 121 mg/dL — ABNORMAL HIGH (ref 65–99)
Potassium: 3.6 mmol/L (ref 3.5–5.1)
Sodium: 137 mmol/L (ref 135–145)
Total Bilirubin: 0.4 mg/dL (ref 0.3–1.2)
Total Protein: 7 g/dL (ref 6.5–8.1)

## 2015-04-22 LAB — URINALYSIS, ROUTINE W REFLEX MICROSCOPIC
BILIRUBIN URINE: NEGATIVE
GLUCOSE, UA: NEGATIVE mg/dL
HGB URINE DIPSTICK: NEGATIVE
KETONES UR: NEGATIVE mg/dL
Leukocytes, UA: NEGATIVE
Nitrite: NEGATIVE
PH: 6 (ref 5.0–8.0)
Protein, ur: NEGATIVE mg/dL
SPECIFIC GRAVITY, URINE: 1.002 — AB (ref 1.005–1.030)
Urobilinogen, UA: 0.2 mg/dL (ref 0.0–1.0)

## 2015-04-22 LAB — POC URINE PREG, ED: Preg Test, Ur: NEGATIVE

## 2015-04-22 MED ORDER — FERROUS SULFATE 325 (65 FE) MG PO TABS: 325.0000 mg | ORAL_TABLET | Freq: Every day | ORAL | Status: AC

## 2015-04-22 NOTE — ED Notes (Signed)
Pt stable, ambulatory, family at bedside, states understanding of discharge instructions 

## 2015-04-22 NOTE — ED Provider Notes (Signed)
CSN: [id number]     Arrival date & time 04/21/15  2311 History  This chart was scribed for Marisa Severin, MD by Bronson Curb, ED Scribe. This patient was seen in room D31C/D31C and the patient's care was started at 2:08 AM.    Chief Complaint  Patient presents with  . Dizziness  . Headache    The history is provided by the patient. No language interpreter was used.     HPI Comments: Suzanne Ramirez is a 34 y.o. female, with no significant past medical history, who presents to the Emergency Department complaining of constant, moderate dizziness upon waking yesterday morning. There is associated LLE weakness lasting only a few seconds and moderate headache to the back of the head. Patient has taken Tylenol with improvement of headache and reports eating and drinking normally. She notes history of anemia in the past, during her pregnancy. She was also informed of an iron deficiency by her PCP and was subsequently prescribed iron supplements. She denies any deviation from her normal daily activities, but mentions she has been under a lot a stress recently. She also mentions chest pain 4 days ago with associated dizziness that lasted throughout the day. She denies any chest pain at this time. No sick contacts. She further denies nausea, vomiting, or fever.    Past Medical History  Diagnosis Date  . No pertinent past medical history   . Normal vaginal delivery     2007 and 2010  . Gallstones    Past Surgical History  Procedure Laterality Date  . No past surgeries    . Laparoscopic cholecystectomy single site with intraoperative cholangiogram N/A 12/04/2014    Procedure: LAPAROSCOPIC CHOLECYSTECTOMY SINGLE SITE WITH INTRAOPERATIVE CHOLANGIOGRAM;  Surgeon: Karie Soda, MD;  Location: [facility];  Service: General;  Laterality: N/A;   Family History  Problem Relation Age of Onset  . Other Neg Hx    History  Substance Use Topics  . Smoking status: Never Smoker   . Smokeless tobacco: Never  Used  . Alcohol Use: No   OB History    Gravida Para Term Preterm AB TAB SAB Ectopic Multiple Living   Review of Systems  Constitutional: Negative for fever.  Cardiovascular: Negative for chest pain.  Gastrointestinal: Negative for nausea and vomiting.  Neurological: Positive for dizziness, weakness and headaches.      Allergies  Review of patient's allergies indicates no known allergies.  Home Medications   Prior to Admission medications   Medication Sig Start Date End Date Taking? Authorizing Provider  acetaminophen (TYLENOL) 500 MG tablet Take 500-1,000 mg by mouth every 6 (six) hours as needed (Pain).    Historical Provider, MD  Fe Fum-Fe Poly-Vit C-Lactobac (FUSION) 65-65-25-30 MG CAPS Take 1 tablet by mouth daily.    Historical Provider, MD  HYDROcodone-acetaminophen (NORCO/VICODIN) 5-325 MG per tablet Take 1-2 tablets by mouth every 6 (six) hours as needed for moderate pain or severe pain. 12/04/14   Karie Soda, MD  ibuprofen (ADVIL,MOTRIN) 200 MG tablet Take 400 mg by mouth every 6 (six) hours as needed (Pain).    Historical Provider, MD   Triage Vitals: BP 122/67 mmHg  Pulse 77  Temp(Src) 97.7 F (36.5 C) (Oral)  Resp 20  Ht  (1.626 m)  Wt 164 lb (74.39 kg)  BMI 28.14 kg/m2  SpO2 99%  LMP 04/09/2015  Physical Exam  Constitutional: She is oriented to person,  place, and time. She appears well-developed and well-nourished. No distress.  HENT:  Head: Normocephalic and atraumatic.  Eyes: Conjunctivae and EOM are normal.  Neck: Neck supple. No tracheal deviation present.  Cardiovascular: Normal rate.   Pulmonary/Chest: Effort normal. No respiratory distress.  Musculoskeletal: Normal range of motion.  Neurological: She is alert and oriented to person, place, and time.  Skin: Skin is warm and dry.  Psychiatric: She has a normal mood and affect. Her behavior is normal.  Nursing note and vitals reviewed.   ED Course  Procedures (including  critical care time)  DIAGNOSTIC STUDIES: Oxygen Saturation is 99% on room air, normal by my interpretation.    COORDINATION OF CARE: At 0215 Discussed treatment plan with patient. Patient agrees.   Labs Review Labs Reviewed  CBC WITH DIFFERENTIAL/PLATELET - Abnormal; Notable for the following:    Hemoglobin 10.9 (*)    HCT 33.6 (*)    MCV 76.2 (*)    MCH 24.7 (*)    Neutrophils Relative % 36 (*)    Lymphocytes Relative 55 (*)    Lymphs Abs 4.5 (*)    All other components within normal limits  COMPREHENSIVE METABOLIC PANEL - Abnormal; Notable for the following:    Glucose, Bld 121 (*)    All other components within normal limits  URINALYSIS, ROUTINE W REFLEX MICROSCOPIC (NOT AT [facility]) - Abnormal; Notable for the following:    Specific Gravity, Urine 1.002 (*)    All other components within normal limits  POC URINE PREG, ED  POC URINE PREG, ED    Imaging Review Dg Chest 2 View  04/22/2015   CLINICAL DATA:  Chest pain.  Dizziness.  EXAM: CHEST  2 VIEW  COMPARISON:  None.  FINDINGS: The heart size and mediastinal contours are within normal limits. Both lungs are clear. The visualized skeletal structures are unremarkable.  IMPRESSION: No active cardiopulmonary disease.   Electronically Signed   By: Davonna BellingJohn  Curnes M.D.   On: 04/22/2015 00:45     EKG Interpretation   Date/Time:  Monday April 22 2015 00:10:49 EDT Ventricular Rate:  73 PR Interval:  136 QRS Duration: 84 QT Interval:  392 QTC Calculation: 431 R Axis:   61 Text Interpretation:  Normal sinus rhythm Normal ECG Confirmed by Tanylah Schnoebelen   MD, Myishia Kasik ([id number]) on 04/22/2015 2:03:57 AM      MDM   Final diagnoses:  Dizziness  Iron deficiency anemia   [age] year old female with vague complaints of dizziness.  History of anemia, although not severe today.  Her workup was otherwise unremarkable.  Plan for iron supplementation and follow-up with primary care doctor.    I personally performed the services described in this  documentation, which was scribed in my presence. The recorded information has been reviewed and is accurate.    Marisa Severinlga Dario Yono, MD 04/22/15 [phone]

## 2015-04-22 NOTE — Discharge Instructions (Signed)
Dizziness °Dizziness is a common problem. It is a feeling of unsteadiness or light-headedness. You may feel like you are about to faint. Dizziness can lead to injury if you stumble or fall. A person of any age group can suffer from dizziness, but dizziness is more common in older adults. °CAUSES  °Dizziness can be caused by many different things, including: °· Middle ear problems. °· Standing for too long. °· Infections. °· An allergic reaction. °· Aging. °· An emotional response to something, such as the sight of blood. °· Side effects of medicines. °· Tiredness. °· Problems with circulation or blood pressure. °· Excessive use of alcohol or medicines, or illegal drug use. °· Breathing too fast (hyperventilation). °· An irregular heart rhythm (arrhythmia). °· A low red blood cell count (anemia). °· Pregnancy. °· Vomiting, diarrhea, fever, or other illnesses that cause body fluid loss (dehydration). °· Diseases or conditions such as Parkinson's disease, high blood pressure (hypertension), diabetes, and thyroid problems. °· Exposure to extreme heat. °DIAGNOSIS  °Your health care provider will ask about your symptoms, perform a physical exam, and perform an electrocardiogram (ECG) to record the electrical activity of your heart. Your health care provider may also perform other heart or blood tests to determine the cause of your dizziness. These may include: °· Transthoracic echocardiogram (TTE). During echocardiography, sound waves are used to evaluate how blood flows through your heart. °· Transesophageal echocardiogram (TEE). °· Cardiac monitoring. This allows your health care provider to monitor your heart rate and rhythm in real time. °· Holter monitor. This is a portable device that records your heartbeat and can help diagnose heart arrhythmias. It allows your health care provider to track your heart activity for several days if needed. °· Stress tests by exercise or by giving medicine that makes the heart beat  faster. °TREATMENT  °Treatment of dizziness depends on the cause of your symptoms and can vary greatly. °HOME CARE INSTRUCTIONS  °· Drink enough fluids to keep your urine clear or pale yellow. This is especially important in very hot weather. In older adults, it is also important in cold weather. °· Take your medicine exactly as directed if your dizziness is caused by medicines. When taking blood pressure medicines, it is especially important to get up slowly. °· Rise slowly from chairs and steady yourself until you feel okay. °· In the morning, first sit up on the side of the bed. When you feel okay, stand slowly while holding onto something until you know your balance is fine. °· Move your legs often if you need to stand in one place for a long time. Tighten and relax your muscles in your legs while standing. °· Have someone stay with you for 1-2 days if dizziness continues to be a problem. Do this until you feel you are well enough to stay alone. Have the person call your health care provider if he or she notices changes in you that are concerning. °· Do not drive or use heavy machinery if you feel dizzy. °· Do not drink alcohol. °SEEK IMMEDIATE MEDICAL CARE IF:  °· Your dizziness or light-headedness gets worse. °· You feel nauseous or vomit. °· You have problems talking, walking, or using your arms, hands, or legs. °· You feel weak. °· You are not thinking clearly or you have trouble forming sentences. It may take a friend or family member to notice this. °· You have chest pain, abdominal pain, shortness of breath, or sweating. °· Your vision changes. °· You notice   any bleeding.  You have side effects from medicine that seems to be getting worse rather than better. MAKE SURE YOU:   Understand these instructions.  Will watch your condition.  Will get help right away if you are not doing well or get worse. Document Released: 04/28/2001 Document Revised: 11/07/2013 Document Reviewed: 05/22/2011 [facility]  Patient Information 2015 [place], [place]. This information is not intended to replace advice given to you by your health care provider. Make sure you discuss any questions you have with your health care provider.  Iron Deficiency Anemia Anemia is a condition in which there are less red blood cells or hemoglobin in the blood than normal. Hemoglobin is the part of red blood cells that carries oxygen. Iron deficiency anemia is anemia caused by too little iron. It is the most common type of anemia. It may leave you tired and short of breath. CAUSES   Lack of iron in the diet.  Poor absorption of iron, as seen with intestinal disorders.  Intestinal bleeding.  Heavy periods. SIGNS AND SYMPTOMS  Mild anemia may not be noticeable. Symptoms may include:  Fatigue.  Headache.  Pale skin.  Weakness.  Tiredness.  Shortness of breath.  Dizziness.  Cold hands and feet.  Fast or irregular heartbeat. DIAGNOSIS  Diagnosis requires a thorough evaluation and physical exam by your health care provider. Blood tests are generally used to confirm iron deficiency anemia. Additional tests may be done to find the underlying cause of your anemia. These may include:  Testing for blood in the stool (fecal occult blood test).  A procedure to see inside the colon and rectum (colonoscopy).  A procedure to see inside the esophagus and stomach (endoscopy). TREATMENT  Iron deficiency anemia is treated by correcting the cause of the deficiency. Treatment may involve:  Adding iron-rich foods to your diet.  Taking iron supplements. Pregnant or breastfeeding women need to take extra iron because their normal diet usually does not provide the required amount.  Taking vitamins. Vitamin C improves the absorption of iron. Your health care provider may recommend that you take your iron tablets with a glass of orange juice or vitamin C supplement.  Medicines to make heavy menstrual flow lighter.  Surgery. HOME  CARE INSTRUCTIONS   Take iron as directed by your health care provider.  If you cannot tolerate taking iron supplements by mouth, talk to your health care provider about taking them through a vein (intravenously) or an injection into a muscle.  For the best iron absorption, iron supplements should be taken on an empty stomach. If you cannot tolerate them on an empty stomach, you may need to take them with food.  Do not drink milk or take antacids at the same time as your iron supplements. Milk and antacids may interfere with the absorption of iron.  Iron supplements can cause constipation. Make sure to include fiber in your diet to prevent constipation. A stool softener may also be recommended.  Take vitamins as directed by your health care provider.  Eat a diet rich in iron. Foods high in iron include liver, lean beef, whole-grain bread, eggs, dried fruit, and dark green leafy vegetables. SEEK IMMEDIATE MEDICAL CARE IF:   You faint. If this happens, do not drive. Call your local emergency services (911 in [place] if no other help is available.  You have chest pain.  You feel nauseous or vomit.  You have severe or increased shortness of breath with activity.  You feel weak.  You have  a rapid heartbeat.  You have unexplained sweating.  You become light-headed when getting up from a chair or bed. MAKE SURE YOU:   Understand these instructions.  Will watch your condition.  Will get help right away if you are not doing well or get worse. Document Released: 10/30/2000 Document Revised: 11/07/2013 Document Reviewed: 07/10/2013 [facility] Patient Information 2015 [place]. This information is not intended to replace advice given to you by your health care provider. Make sure you discuss any questions you have with your health care provider.  Iron-Rich Diet An iron-rich diet contains foods that are good sources of iron. Iron is an important mineral that helps your body produce  hemoglobin. Hemoglobin is a protein in red blood cells that carries oxygen to the body's tissues. Sometimes, the iron level in your blood can be low. This may be caused by:  A lack of iron in your diet.  Blood loss.  Times of growth, such as during pregnancy or during a child's growth and development. Low levels of iron can cause a decrease in the number of red blood cells. This can result in iron deficiency anemia. Iron deficiency anemia symptoms include:  Tiredness.  Weakness.  Irritability.  Increased chance of infection. Here are some recommendations for daily iron intake:  Males older than 35 years of age need 8 mg of iron per day.  Women ages 4319 to 7950 need 18 mg of iron per day.  Pregnant women need 27 mg of iron per day, and women who are over 35 years of age and breastfeeding need 9 mg of iron per day.  Women over the age of 35 need 8 mg of iron per day. SOURCES OF IRON There are 2 types of iron that are found in food: heme iron and nonheme iron. Heme iron is absorbed by the body better than nonheme iron. Heme iron is found in meat, poultry, and fish. Nonheme iron is found in grains, beans, and vegetables. Heme Iron Sources Food / Iron (mg)  Chicken liver, 3 oz (85 g)/ 10 mg  Beef liver, 3 oz (85 g)/ 5.5 mg  Oysters, 3 oz (85 g)/ 8 mg  Beef, 3 oz (85 g)/ 2 to 3 mg  Shrimp, 3 oz (85 g)/ 2.8 mg  [place], 3 oz (85 g)/ 2 mg  Chicken, 3 oz (85 g) / 1 mg  Fish (tuna, halibut), 3 oz (85 g)/ 1 mg  Pork, 3 oz (85 g)/ 0.9 mg Nonheme Iron Sources Food / Iron (mg)  Ready-to-eat breakfast cereal, iron-fortified / 3.9 to 7 mg  Tofu,  cup / 3.4 mg  Kidney beans,  cup / 2.6 mg  Baked potato with skin / 2.7 mg  Asparagus,  cup / 2.2 mg  Avocado / 2 mg  Dried peaches,  cup / 1.6 mg  Raisins,  cup / 1.5 mg  Soy milk, 1 cup / 1.5 mg  Whole-wheat bread, 1 slice / 1.2 mg  Spinach, 1 cup / 0.8 mg  Broccoli,  cup / 0.6 mg IRON ABSORPTION Certain foods  can decrease the body's absorption of iron. Try to avoid these foods and beverages while eating meals with iron-containing foods:  Coffee.  Tea.  Fiber.  Soy. Foods containing vitamin C can help increase the amount of iron your body absorbs from iron sources, especially from nonheme sources. Eat foods with vitamin C along with iron-containing foods to increase your iron absorption. Foods that are high in vitamin C include many fruits and  vegetables. Some good sources are:  Fresh orange juice.  Oranges.  Strawberries.  Mangoes.  Grapefruit.  Red bell peppers.  Green bell peppers.  Broccoli.  Potatoes with skin.  Tomato juice. Document Released: 06/16/2005 Document Revised: 01/25/2012 Document Reviewed: 04/23/2011 [facility] Patient Information 2015 [place], [place]. This information is not intended to replace advice given to you by your health care provider. Make sure you discuss any questions you have with your health care provider.

## 2015-06-05 ENCOUNTER — Other Ambulatory Visit: Payer: Self-pay | Admitting: Internal Medicine

## 2015-06-05 DIAGNOSIS — E049 Nontoxic goiter, unspecified: Secondary | ICD-10-CM

## 2015-06-10 ENCOUNTER — Ambulatory Visit
Admission: RE | Admit: 2015-06-10 | Discharge: 2015-06-10 | Disposition: A | Discharge status: Home / Self Care | Payer: 59 | Source: Ambulatory Visit | Attending: Internal Medicine | Admitting: Internal Medicine

## 2015-06-10 DIAGNOSIS — E049 Nontoxic goiter, unspecified: Secondary | ICD-10-CM

## 2015-07-08 LAB — OB RESULTS CONSOLE GC/CHLAMYDIA
Chlamydia: NEGATIVE
Gonorrhea: NEGATIVE

## 2015-07-08 LAB — OB RESULTS CONSOLE HEPATITIS B SURFACE ANTIGEN: Hepatitis B Surface Ag: NEGATIVE

## 2015-07-08 LAB — OB RESULTS CONSOLE HIV ANTIBODY (ROUTINE TESTING): HIV: NONREACTIVE

## 2015-07-08 LAB — OB RESULTS CONSOLE ABO/RH: RH Type: POSITIVE

## 2015-07-08 LAB — OB RESULTS CONSOLE ANTIBODY SCREEN: ANTIBODY SCREEN: NEGATIVE

## 2015-07-08 LAB — OB RESULTS CONSOLE RUBELLA ANTIBODY, IGM: Rubella: IMMUNE

## 2015-07-08 LAB — OB RESULTS CONSOLE RPR: RPR: NONREACTIVE

## 2015-11-17 NOTE — L&D Delivery Note (Signed)
Delivery Note At 12:22 PM a viable female was delivered via Vaginal, Spontaneous Delivery (Presentation: Right Occiput Anterior).  APGAR: 9, 9; weight pending.   Placenta status: Intact, Spontaneous.  Cord:  with the following complications: None.   Anesthesia: Epidural  Episiotomy: None Lacerations: 2nd degree, right labial Suture Repair: 3.0 vicryl rapide Est. Blood Loss (mL): 200  Mom to postpartum.  Baby to Couplet care / Skin to Skin.  Lincy Belles D 02/05/2016, 12:35 PM

## 2015-11-27 ENCOUNTER — Encounter: Payer: Medicaid Other | Attending: Obstetrics and Gynecology

## 2015-11-27 VITALS — Ht 64.0 in | Wt 186.8 lb

## 2015-11-27 DIAGNOSIS — Z713 Dietary counseling and surveillance: Secondary | ICD-10-CM | POA: Insufficient documentation

## 2015-11-27 DIAGNOSIS — O24419 Gestational diabetes mellitus in pregnancy, unspecified control: Secondary | ICD-10-CM | POA: Diagnosis not present

## 2015-12-02 ENCOUNTER — Observation Stay (HOSPITAL_COMMUNITY)
Admission: EM | Admit: 2015-12-02 | Discharge: 2015-12-03 | Disposition: A | Discharge status: Other Acute Inpatient Hospital | Payer: Medicaid Other | Attending: Obstetrics and Gynecology | Admitting: Obstetrics and Gynecology

## 2015-12-02 ENCOUNTER — Encounter (HOSPITAL_COMMUNITY): Payer: Self-pay | Admitting: *Deleted

## 2015-12-02 DIAGNOSIS — Y999 Unspecified external cause status: Secondary | ICD-10-CM | POA: Insufficient documentation

## 2015-12-02 DIAGNOSIS — O9989 Other specified diseases and conditions complicating pregnancy, childbirth and the puerperium: Secondary | ICD-10-CM | POA: Diagnosis not present

## 2015-12-02 DIAGNOSIS — W01198A Fall on same level from slipping, tripping and stumbling with subsequent striking against other object, initial encounter: Secondary | ICD-10-CM | POA: Diagnosis not present

## 2015-12-02 DIAGNOSIS — Y939 Activity, unspecified: Secondary | ICD-10-CM | POA: Diagnosis not present

## 2015-12-02 DIAGNOSIS — Z3A33 33 weeks gestation of pregnancy: Secondary | ICD-10-CM | POA: Insufficient documentation

## 2015-12-02 DIAGNOSIS — S3991XA Unspecified injury of abdomen, initial encounter: Secondary | ICD-10-CM | POA: Diagnosis present

## 2015-12-02 DIAGNOSIS — W19XXXA Unspecified fall, initial encounter: Secondary | ICD-10-CM | POA: Diagnosis present

## 2015-12-02 DIAGNOSIS — Y929 Unspecified place or not applicable: Secondary | ICD-10-CM | POA: Diagnosis not present

## 2015-12-02 DIAGNOSIS — Z3490 Encounter for supervision of normal pregnancy, unspecified, unspecified trimester: Secondary | ICD-10-CM

## 2015-12-02 LAB — COMPREHENSIVE METABOLIC PANEL
ALK PHOS: 69 U/L (ref 38–126)
ALT: 9 U/L — AB (ref 14–54)
AST: 15 U/L (ref 15–41)
Albumin: 2.9 g/dL — ABNORMAL LOW (ref 3.5–5.0)
Anion gap: 8 (ref 5–15)
BILIRUBIN TOTAL: 0.5 mg/dL (ref 0.3–1.2)
CO2: 21 mmol/L — ABNORMAL LOW (ref 22–32)
CREATININE: 0.41 mg/dL — AB (ref 0.44–1.00)
Calcium: 8.7 mg/dL — ABNORMAL LOW (ref 8.9–10.3)
Chloride: 108 mmol/L (ref 101–111)
GFR calc Af Amer: >60 mL/min (ref >60)
Glucose, Bld: 88 mg/dL (ref 65–99)
Potassium: 4 mmol/L (ref 3.5–5.1)
Sodium: 137 mmol/L (ref 135–145)
TOTAL PROTEIN: 6.2 g/dL — AB (ref 6.5–8.1)

## 2015-12-02 LAB — CBC
HCT: 34.1 % — ABNORMAL LOW (ref 36.0–46.0)
Hemoglobin: 10.9 g/dL — ABNORMAL LOW (ref 12.0–15.0)
MCH: 24.2 pg — ABNORMAL LOW (ref 26.0–34.0)
MCHC: 32 g/dL (ref 30.0–36.0)
MCV: 75.8 fL — AB (ref 78.0–100.0)
PLATELETS: 187 10*3/uL (ref 150–400)
RBC: 4.5 MIL/uL (ref 3.87–5.11)
RDW: 14 % (ref 11.5–15.5)
WBC: 7.3 10*3/uL (ref 4.0–10.5)

## 2015-12-02 LAB — TYPE AND SCREEN
ABO/RH(D): AB POS
ANTIBODY SCREEN: NEGATIVE

## 2015-12-02 LAB — SAMPLE TO BLOOD BANK

## 2015-12-02 LAB — I-STAT CHEM 8, ED
CALCIUM ION: 1.15 mmol/L (ref 1.12–1.23)
Chloride: 103 mmol/L (ref 101–111)
Creatinine, Ser: 0.3 mg/dL — ABNORMAL LOW (ref 0.44–1.00)
GLUCOSE: 82 mg/dL (ref 65–99)
HCT: 35 % — ABNORMAL LOW (ref 36.0–46.0)
Hemoglobin: 11.9 g/dL — ABNORMAL LOW (ref 12.0–15.0)
Potassium: 3.7 mmol/L (ref 3.5–5.1)
SODIUM: 139 mmol/L (ref 135–145)
TCO2: 21 mmol/L (ref 0–100)

## 2015-12-02 LAB — GLUCOSE, CAPILLARY: Glucose-Capillary: 53 mg/dL — ABNORMAL LOW (ref 65–99)

## 2015-12-02 MED ORDER — ZOLPIDEM TARTRATE 5 MG PO TABS: 10.0000 mg | ORAL_TABLET | Freq: Every evening | ORAL | Status: DC | PRN

## 2015-12-02 MED ORDER — ACETAMINOPHEN 325 MG PO TABS: 650.0000 mg | ORAL_TABLET | ORAL | Status: DC | PRN

## 2015-12-02 NOTE — H&P (Signed)
Amandy Roslyn SmilingG Mcfadden is a 35 y.o. female G 4P3003 at 6130 1/7 weeks (EDD 02/09/16 by LMP c/w 8 week US) presenting after she slipped and fell and hit her abdomen. She is have mild contractions, but does not really feel them.  She has no LOF and no bleeding.  Prenatal care is significant for GDM, diet controlled.  She is also AMA and had a low risk Panorama test.    Maternal Medical History:  Contractions: Frequency: irregular.   Perceived severity is mild.    Fetal activity: Perceived fetal activity is normal.    Prenatal Complications - Diabetes: gestational.    OB History    Gravida Para Term Preterm AB TAB SAB Ectopic Multiple Living   4 3 3       3     NSVD x 3 (7+lbs)  History reviewed. No pertinent past medical history. Past Surgical History  Procedure Laterality Date  . Cholecystectomy     Family History: family history is not on file. Social History:  reports that she has never smoked. She does not have any smokeless tobacco history on file. Her alcohol and drug histories are not on file.   Prenatal Transfer Tool  Maternal Diabetes: Yes:  Diabetes Type:  Diet controlled Genetic Screening: Normal Maternal Ultrasounds/Referrals: Normal Fetal Ultrasounds or other Referrals:  None Maternal Substance Abuse:  No Significant Maternal Medications:  None Significant Maternal Lab Results:  None Other Comments:  None  Review of Systems  Gastrointestinal: Positive for abdominal pain (sore on right where fell).  Neurological: Negative for headaches.      Blood pressure 110/59, pulse 88, temperature 98.4 F (36.9 C), temperature source Oral, resp. rate 16, height 5\' 4"  (1.626 m), weight 83.915 kg (185 lb), SpO2 100 %. Maternal Exam:  Uterine Assessment: Contraction strength is mild.  Contraction frequency is irregular.   Introitus: Normal vulva. Normal vagina.    Physical Exam  Constitutional: She appears well-developed and well-nourished.  Cardiovascular: Normal rate.    Respiratory: Effort normal.  GI: Soft.  Genitourinary: Vagina normal.  Neurological: She is alert.  Psychiatric: She has a normal mood and affect.    Prenatal labs: ABO, Rh:  AB positive Antibody:  Negative Rubella:  Immune RPR:   NR HBsAg:   Neg HIV:  NR  GBS:   unknown Panorama low risk One hour GCT 163 3 hour GTT abnormal  Assessment/Plan: Pt admitted for extended monitoring due to fall and striking abdomen.  Will allow carb modified diet as FHR category 1 thus far.   Tylenol for muscle soreness.  Blood type AB positive.     Oliver PilaRICHARDSON,Shavontae Gibeault W 12/02/2015, 9:18 PM

## 2015-12-02 NOTE — ED Notes (Signed)
Called Dr Senaida Oresrichardson, informed of pt status, at [place], pt fell on right side hitting her belly, pt to have 24 hrs of monitoring, will informd ED Dr.

## 2015-12-02 NOTE — ED Notes (Signed)
Patient ambulated to restroom per EDP and OBGYN okay, patient tolerated well. Complained of rlq pain with ambulation. POC to send to womens for 24hr obs. Patient and husband aware.

## 2015-12-02 NOTE — Progress Notes (Signed)
Met with pt.'s spouse and led him back to the ED to visit with his wife and provided emotional support to both.

## 2015-12-02 NOTE — ED Notes (Signed)
[facility] NOTIFIED OF TRANSFER

## 2015-12-02 NOTE — Progress Notes (Signed)
Pt CBG 53 prior to eating, tray just arrived and encouraged pt to eat as well as drink cranberry juice.

## 2015-12-03 LAB — ABO/RH: ABO/RH(D): AB POS

## 2015-12-03 LAB — KLEIHAUER-BETKE STAIN
# Vials RhIg: 1
Fetal Cells %: 0 %
QUANTITATION FETAL HEMOGLOBIN: 0 mL

## 2015-12-03 LAB — GLUCOSE, CAPILLARY
GLUCOSE-CAPILLARY: 121 mg/dL — AB (ref 65–99)
GLUCOSE-CAPILLARY: 79 mg/dL (ref 65–99)
Glucose-Capillary: 101 mg/dL — ABNORMAL HIGH (ref 65–99)

## 2015-12-03 NOTE — Discharge Summary (Signed)
Physician Discharge Summary  Patient ID: Suzanne Ramirez MRN: [id number] DOB/AGE: Mar 29, 1981 34 y.o.  Admit date: 12/02/2015 Discharge date: 12/03/2015  Admission Diagnoses: 30+ weeks with abdominal trauma Discharge Diagnoses:  Active Problems:   Fall   Discharged Condition: improved  Hospital Course: Pt admitted for prolonged monitoring s/p fall.  Category 1 tracing  Consults: none   Discharge Exam: Blood pressure 110/63, pulse 90, temperature 98.4 F (36.9 C), temperature source Oral, resp. rate 16, height  (1.626 m), weight 83.915 kg (185 lb), SpO2 100 %. Abdomen gravid and slightly tender  Disposition: Final discharge disposition not confirmed     Medication List    ASK your doctor about these medications        prenatal multivitamin Tabs tablet  Take 1 tablet by mouth daily at 12 noon.           Follow-up Information    Follow up with MEISINGER,TODD D, MD.   Specialty:  Obstetrics and Gynecology   Why:  come for next scheduled appt   Contact information:   [address], [address] [place] [address] [phone]       Signed: Oliver Pila 12/03/2015, 8:59 AM

## 2015-12-03 NOTE — ED Provider Notes (Signed)
CSN: [id number]     Arrival date & time 12/02/15  1804 History   First MD Initiated Contact with Patient 12/02/15 1815     Chief Complaint  Patient presents with  . Fall  . Abdominal Pain      Patient is a 35 y.o. female presenting with fall and abdominal pain. The history is provided by the patient.  Fall Associated symptoms include abdominal pain. Pertinent negatives include no chest pain, no headaches and no shortness of breath.  Abdominal Pain Associated symptoms: no chest pain, no diarrhea, no fever, no nausea, no shortness of breath and no vomiting    patient is around [redacted] weeks pregnant. Sees Dr. Jackelyn Knife. Today she slipped and fell and struck the right side of her abdomen. She's had some pain. Still feeling baby move. Did not hit anywhere else. No chest pain or trouble breathing. She states she thinks her blood type is AB-. No vaginal bleeding or discharge. No lightheadedness or dizziness. She is not feeling contractions but does have right lower abdominal pain.   History reviewed. No pertinent past medical history. Past Surgical History  Procedure Laterality Date  . Cholecystectomy    . Wisdom tooth extraction Bilateral 2013    upper   History reviewed. No pertinent family history. Social History  Substance Use Topics  . Smoking status: Never Smoker   . Smokeless tobacco: Never Used  . Alcohol Use: No   OB History    Gravida Para Term Preterm AB TAB SAB Ectopic Multiple Living   Review of Systems  Constitutional: Negative for fever, activity change and appetite change.  Eyes: Negative for pain.  Respiratory: Negative for chest tightness and shortness of breath.   Cardiovascular: Negative for chest pain and leg swelling.  Gastrointestinal: Positive for abdominal pain. Negative for nausea, vomiting and diarrhea.  Genitourinary: Negative for flank pain.  Musculoskeletal: Negative for back pain and neck stiffness.  Skin: Negative for rash.   Neurological: Negative for weakness, numbness and headaches.  Psychiatric/Behavioral: Negative for behavioral problems.      Allergies  Review of patient's allergies indicates no known allergies.  Home Medications   Prior to Admission medications   Medication Sig Start Date End Date Taking? Authorizing Provider  Prenatal Vit-Fe Fumarate-FA (PRENATAL MULTIVITAMIN) TABS tablet Take 1 tablet by mouth daily at 12 noon.   Yes Historical Provider, MD   BP 110/59 mmHg  Pulse 88  Temp(Src) 98.4 F (36.9 C) (Oral)  Resp 16  Ht  (1.626 m)  Wt 185 lb (83.915 kg)  BMI 31.74 kg/m2  SpO2 100% Physical Exam  Constitutional: She appears well-developed.  HENT:  Head: Normocephalic.  Cardiovascular: Normal rate.   Pulmonary/Chest: Effort normal.  Abdominal: There is tenderness.  Patient is gravida well above the umbilicus. Some right lower quadrant tenderness. No ecchymosis.  Musculoskeletal: Normal range of motion.  Neurological: She is alert.  Skin: Skin is warm.    ED Course  Procedures (including critical care time) Labs Review Labs Reviewed  COMPREHENSIVE METABOLIC PANEL - Abnormal; Notable for the following:    CO2 21 (*)    BUN <5 (*)    Creatinine, Ser 0.41 (*)    Calcium 8.7 (*)    Total Protein 6.2 (*)    Albumin 2.9 (*)    ALT 9 (*)    All other components within normal limits  CBC - Abnormal; Notable for the following:  Hemoglobin 10.9 (*)    HCT 34.1 (*)    MCV 75.8 (*)    MCH 24.2 (*)    All other components within normal limits  GLUCOSE, CAPILLARY - Abnormal; Notable for the following:    Glucose-Capillary 53 (*)    All other components within normal limits  I-STAT CHEM 8, ED - Abnormal; Notable for the following:    BUN <3 (*)    Creatinine, Ser 0.30 (*)    Hemoglobin 11.9 (*)    HCT 35.0 (*)    All other components within normal limits  KLEIHAUER-BETKE STAIN  SAMPLE TO BLOOD BANK  TYPE AND SCREEN  ABO/RH    Imaging Review No results  found. I have personally reviewed and evaluated these images and lab results as part of my medical decision-making.   EKG Interpretation None      MDM   Final diagnoses:  Fall, initial encounter  Pregnant    Patient pregnant with fall. Stated her blood type was AB-. Seen by rapid response nurse. Will transfer to [facility]    Benjiman Core, MD 12/03/15 [phone]

## 2015-12-03 NOTE — Progress Notes (Signed)
  Patient was seen on 11/27/15 for Gestational Diabetes self-management . The following learning objectives were met by the patient :   States the definition of Gestational Diabetes  States why dietary management is important in controlling blood glucose  Describes the effects of carbohydrates on blood glucose levels  Demonstrates ability to create a balanced meal plan  Demonstrates carbohydrate counting   States when to check blood glucose levels  Demonstrates proper blood glucose monitoring techniques  States the effect of stress and exercise on blood glucose levels  States the importance of limiting caffeine and abstaining from alcohol and smoking  Plan:  Aim for 2 Carb Choices per meal (30 grams) +/- 1 either way for breakfast Aim for 3 Carb Choices per meal (45 grams) +/- 1 either way from lunch and dinner Aim for 1-2 Carbs per snack Begin reading food labels for Total Carbohydrate and sugar grams of foods Consider  increasing your activity level by walking daily as tolerated Begin checking BG before breakfast and 1-2 hours after first bit of breakfast, lunch and dinner after  as directed by MD  Take medication  as directed by MD  Blood glucose monitor given: Georgeanne Nim   Lot # [id number] Exp: 2016/08/15 Blood glucose reading: 40m/dl  Patient instructed to monitor glucose levels: FBS: 60 - <90 1 hour: <140 2 hour: <120  Patient received the following handouts:  Nutrition Diabetes and Pregnancy  Carbohydrate Counting List  Meal Planning worksheet  Patient will be seen for follow-up as needed.

## 2015-12-03 NOTE — Progress Notes (Signed)
Patient ID: Suzanne Ramirez, female   DOB: September 04, 1981, 35 y.o.   MRN: [id number] Pt doing well this AM.  Some soreness, but has not been bad enough to warrant  Medicating.  Good FM, no LOF or VB  FHR Category 1  Abdomen sore on right  Pt doing well. Plan 24 hours of monitoring and if no problems with tracing d/c this PM  BS have been reasonable.  FBS 101 this Am but was up late and ate late.

## 2015-12-04 ENCOUNTER — Encounter (HOSPITAL_COMMUNITY): Payer: Self-pay

## 2015-12-27 ENCOUNTER — Encounter (HOSPITAL_COMMUNITY): Payer: Self-pay | Admitting: Neurology

## 2015-12-27 ENCOUNTER — Emergency Department (HOSPITAL_COMMUNITY)
Admission: EM | Admit: 2015-12-27 | Discharge: 2015-12-27 | Disposition: A | Discharge status: Home / Self Care | Payer: Medicaid Other | Attending: Emergency Medicine | Admitting: Emergency Medicine

## 2015-12-27 DIAGNOSIS — Z8632 Personal history of gestational diabetes: Secondary | ICD-10-CM | POA: Diagnosis not present

## 2015-12-27 DIAGNOSIS — Z8719 Personal history of other diseases of the digestive system: Secondary | ICD-10-CM | POA: Diagnosis not present

## 2015-12-27 DIAGNOSIS — Z3A34 34 weeks gestation of pregnancy: Secondary | ICD-10-CM | POA: Diagnosis not present

## 2015-12-27 DIAGNOSIS — O4703 False labor before 37 completed weeks of gestation, third trimester: Secondary | ICD-10-CM

## 2015-12-27 DIAGNOSIS — Z79899 Other long term (current) drug therapy: Secondary | ICD-10-CM | POA: Insufficient documentation

## 2015-12-27 LAB — URINALYSIS, ROUTINE W REFLEX MICROSCOPIC
Bilirubin Urine: NEGATIVE
Glucose, UA: NEGATIVE mg/dL
HGB URINE DIPSTICK: NEGATIVE
KETONES UR: NEGATIVE mg/dL
Leukocytes, UA: NEGATIVE
NITRITE: NEGATIVE
PH: 6 (ref 5.0–8.0)
Protein, ur: NEGATIVE mg/dL
SPECIFIC GRAVITY, URINE: 1.013 (ref 1.005–1.030)

## 2015-12-27 MED ORDER — SODIUM CHLORIDE 0.9 % IV BOLUS (SEPSIS)
1000.0000 mL | Freq: Once | INTRAVENOUS | Status: AC
  Administered 2015-12-27: 1000 mL via INTRAVENOUS

## 2015-12-27 NOTE — ED Notes (Signed)
Pt is 8 months pregnant, contractions started 40 mins ago and are 5 mins apart. This is 4th pregnancy, denies problems. Water has not broken, no vaginal bleeding.

## 2015-12-27 NOTE — Discharge Instructions (Signed)

## 2015-12-27 NOTE — Progress Notes (Signed)
Called dr Senaida Ores, informed of pt status, sve, fhr tracing, ED POC, if reactive tracing and no more than 2 contractions an hour, pt may be OB cleared and can go home.

## 2015-12-27 NOTE — ED Provider Notes (Addendum)
CSN: [id number]     Arrival date & time 12/27/15  4098 History   First MD Initiated Contact with Patient 12/27/15 [phone]     Chief Complaint  Patient presents with  . Contractions     (Consider location/radiation/quality/duration/timing/severity/associated sxs/prior Treatment) HPI Comments: Patient is a G4 P3 who is currently [redacted] weeks pregnant presenting to the emergency room today with contractions for the last 35-40 minutes. The contractions were strong and severe coming approximately every 5-10 minutes. 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 Since sitting down the last contraction has been 15 minutes ago. She has felt good fetal movement today and denies any leaking of fluid, dysuria, vaginal bleeding or vomiting. Patient has had no complications during this pregnancy. She only takes a multivitamin. All other 3 pregnancies were vaginal deliveries at term without complication.  The history is provided by the patient.    Past Medical History  Diagnosis Date  . No pertinent past medical history   . Normal vaginal delivery     2007 and 2010  . Gallstones   . Gestational diabetes mellitus (GDM), antepartum    Past Surgical History  Procedure Laterality Date  . Cholecystectomy    . Wisdom tooth extraction Bilateral 2013    upper  . No past surgeries    . Laparoscopic cholecystectomy single site with intraoperative cholangiogram N/A 12/04/2014    Procedure: LAPAROSCOPIC CHOLECYSTECTOMY SINGLE SITE WITH INTRAOPERATIVE CHOLANGIOGRAM;  Surgeon: Karie Soda, MD;  Location: [facility];  Service: General;  Laterality: N/A;   Family History  Problem Relation Age of Onset  . Other Neg Hx    Social History  Substance Use Topics  . Smoking status: Never Smoker   . Smokeless tobacco: Never Used  . Alcohol Use: No   OB History    Gravida Para Term Preterm AB TAB SAB Ectopic Multiple Living   0 0 0 0 0  3     Review of Systems  All  other systems reviewed and are negative.     Allergies  Review of patient's allergies indicates no known allergies.  Home Medications   Prior to Admission medications   Medication Sig Start Date End Date Taking? Authorizing Provider  acetaminophen (TYLENOL) 500 MG tablet Take 500-1,000 mg by mouth every 6 (six) hours as needed (Pain).    Historical Provider, MD  Fe Fum-Fe Poly-Vit C-Lactobac (FUSION) 65-65-25-30 MG CAPS Take 1 tablet by mouth daily.    Historical Provider, MD  ferrous sulfate 325 (65 FE) MG tablet Take 1 tablet (325 mg total) by mouth daily. 04/22/15   Marisa Severin, MD  HYDROcodone-acetaminophen (NORCO/VICODIN) 5-325 MG per tablet Take 1-2 tablets by mouth every 6 (six) hours as needed for moderate pain or severe pain. 12/04/14   Karie Soda, MD  ibuprofen (ADVIL,MOTRIN) 200 MG tablet Take 400 mg by mouth every 6 (six) hours as needed (Pain).    Historical Provider, MD  Prenatal Vit-Fe Fumarate-FA (PRENATAL MULTIVITAMIN) TABS tablet Take 1 tablet by mouth daily at 12 noon.    Historical Provider, MD   BP 113/69 mmHg  Pulse 99  Temp(Src) 98.4 F (36.9 C) (Oral)  Resp 16  SpO2 100%  LMP 04/09/2015 Physical Exam  Constitutional: She is oriented to person, place, and time. She appears well-developed and well-nourished. No distress.  HENT:  Head: Normocephalic and atraumatic.  Mouth/Throat: Oropharynx is clear and moist.  Eyes: Conjunctivae and EOM are  normal. Pupils are equal, round, and reactive to light.  Neck: Normal range of motion. Neck supple.  Cardiovascular: Normal rate, regular rhythm and intact distal pulses.   No murmur heard. Pulmonary/Chest: Effort normal and breath sounds normal. No respiratory distress. She has no wheezes. She has no rales.  Abdominal: Soft. She exhibits no distension. There is no tenderness. There is no rebound and no guarding.  Gravid uterus up to the xiphoid. Fetal movement palpated. No abdominal pain at this time.  Genitourinary:   Cervix is fingertip, thick and high. No vaginal bleeding present.  Musculoskeletal: Normal range of motion. She exhibits no edema or tenderness.  Neurological: She is alert and oriented to person, place, and time.  Skin: Skin is warm and dry. No rash noted. No erythema.  Psychiatric: She has a normal mood and affect. Her behavior is normal.  Nursing note and vitals reviewed.   ED Course  Procedures (including critical care time) Labs Review Labs Reviewed  URINALYSIS, ROUTINE W REFLEX MICROSCOPIC (NOT AT [facility])    Imaging Review No results found. I have personally reviewed and evaluated these images and lab results as part of my medical decision-making.   EKG Interpretation None      MDM   Final diagnoses:  Preterm contractions, third trimester   patient presenting at [redacted] weeks pregnant with contractions for the last 40 minutes 5 minutes apart. She has had no complications during this pregnancy and has received routine prenatal care. She denies any vaginal bleeding or fluid leakage. Since sitting down patient's last contraction was approximately 15 minutes ago. 3 pregnancies patient has not had any complications and has delivered on time. 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 She did eat and drink this morning. There is no evidence of hypertension here and fetal heart rate is reassuring at 1:30 and variable. Patient cervix is thick high in fingertip. Will monitor patient, give IV fluids and check a UA. The OB team was notified.  10:18 AM UA within normal limits. Patient's blood pressure has been normal. After IV fluids contractions have ceased. Good fetal movement on the monitor and rapid OB nurse has cleared the patient with the physician. She is well appearing now feel she just needs to go home and rest.  Gwyneth Sprout, MD 12/27/15 1018  Gwyneth Sprout, MD 12/27/15 1119

## 2015-12-27 NOTE — Progress Notes (Signed)
Called to come over to [place], pt 33.5weeks, complaining of contracions every 5 minutes, denies leaking or bleeding. Pt placed on fetal monitors. ED DR checked cervix, FT, thick/high

## 2015-12-27 NOTE — ED Notes (Signed)
OB RN at bedside.  No contractions noted at this time.

## 2015-12-27 NOTE — Progress Notes (Signed)
Called dr Senaida Ores back, updated on pt status, no contractions noted, pt feeling better. OB cleared, ED Dr notified.

## 2016-01-08 LAB — OB RESULTS CONSOLE GBS: GBS: NEGATIVE

## 2016-01-13 ENCOUNTER — Encounter (HOSPITAL_COMMUNITY): Payer: Self-pay | Admitting: *Deleted

## 2016-01-13 ENCOUNTER — Inpatient Hospital Stay (HOSPITAL_COMMUNITY)
Admission: AD | Admit: 2016-01-13 | Discharge: 2016-01-13 | Disposition: A | Discharge status: Home / Self Care | Payer: Medicaid Other | Source: Ambulatory Visit | Attending: Obstetrics and Gynecology | Admitting: Obstetrics and Gynecology

## 2016-01-13 ENCOUNTER — Inpatient Hospital Stay (HOSPITAL_COMMUNITY): Payer: Medicaid Other

## 2016-01-13 DIAGNOSIS — O09523 Supervision of elderly multigravida, third trimester: Secondary | ICD-10-CM | POA: Diagnosis not present

## 2016-01-13 DIAGNOSIS — O24410 Gestational diabetes mellitus in pregnancy, diet controlled: Secondary | ICD-10-CM | POA: Diagnosis not present

## 2016-01-13 DIAGNOSIS — Z9049 Acquired absence of other specified parts of digestive tract: Secondary | ICD-10-CM | POA: Diagnosis not present

## 2016-01-13 DIAGNOSIS — O288 Other abnormal findings on antenatal screening of mother: Secondary | ICD-10-CM

## 2016-01-13 DIAGNOSIS — O24419 Gestational diabetes mellitus in pregnancy, unspecified control: Secondary | ICD-10-CM | POA: Diagnosis not present

## 2016-01-13 DIAGNOSIS — O368130 Decreased fetal movements, third trimester, not applicable or unspecified: Secondary | ICD-10-CM | POA: Diagnosis present

## 2016-01-13 DIAGNOSIS — Z3A36 36 weeks gestation of pregnancy: Secondary | ICD-10-CM | POA: Diagnosis not present

## 2016-01-13 DIAGNOSIS — O289 Unspecified abnormal findings on antenatal screening of mother: Secondary | ICD-10-CM

## 2016-01-13 NOTE — Discharge Instructions (Signed)
Fetal Movement Counts  Patient Name: __________________________________________________ Patient Due Date: ____________________  Performing a fetal movement count is highly recommended in high-risk pregnancies, but it is good for every pregnant woman to do. Your health care provider may ask you to start counting fetal movements at 28 weeks of the pregnancy. Fetal movements often increase:  · After eating a full meal.  · After physical activity.  · After eating or drinking something sweet or cold.  · At rest.  Pay attention to when you feel the baby is most active. This will help you notice a pattern of your baby's sleep and wake cycles and what factors contribute to an increase in fetal movement. It is important to perform a fetal movement count at the same time each day when your baby is normally most active.   HOW TO COUNT FETAL MOVEMENTS  1. Find a quiet and comfortable area to sit or lie down on your left side. Lying on your left side provides the best blood and oxygen circulation to your baby.  2. Write down the day and time on a sheet of paper or in a journal.  3. Start counting kicks, flutters, swishes, rolls, or jabs in a 2-hour period. You should feel at least 10 movements within 2 hours.  4. If you do not feel 10 movements in 2 hours, wait 2-3 hours and count again. Look for a change in the pattern or not enough counts in 2 hours.  SEEK MEDICAL CARE IF:  · You feel less than 10 counts in 2 hours, tried twice.  · There is no movement in over an hour.  · The pattern is changing or taking longer each day to reach 10 counts in 2 hours.  · You feel the baby is not moving as he or she usually does.  Date: ____________ Movements: ____________ Start time: ____________ Finish time: ____________   Date: ____________ Movements: ____________ Start time: ____________ Finish time: ____________  Date: ____________ Movements: ____________ Start time: ____________ Finish time: ____________  Date: ____________ Movements:  ____________ Start time: ____________ Finish time: ____________  Date: ____________ Movements: ____________ Start time: ____________ Finish time: ____________  Date: ____________ Movements: ____________ Start time: ____________ Finish time: ____________  Date: ____________ Movements: ____________ Start time: ____________ Finish time: ____________  Date: ____________ Movements: ____________ Start time: ____________ Finish time: ____________   Date: ____________ Movements: ____________ Start time: ____________ Finish time: ____________  Date: ____________ Movements: ____________ Start time: ____________ Finish time: ____________  Date: ____________ Movements: ____________ Start time: ____________ Finish time: ____________  Date: ____________ Movements: ____________ Start time: ____________ Finish time: ____________  Date: ____________ Movements: ____________ Start time: ____________ Finish time: ____________  Date: ____________ Movements: ____________ Start time: ____________ Finish time: ____________  Date: ____________ Movements: ____________ Start time: ____________ Finish time: ____________   Date: ____________ Movements: ____________ Start time: ____________ Finish time: ____________  Date: ____________ Movements: ____________ Start time: ____________ Finish time: ____________  Date: ____________ Movements: ____________ Start time: ____________ Finish time: ____________  Date: ____________ Movements: ____________ Start time: ____________ Finish time: ____________  Date: ____________ Movements: ____________ Start time: ____________ Finish time: ____________  Date: ____________ Movements: ____________ Start time: ____________ Finish time: ____________  Date: ____________ Movements: ____________ Start time: ____________ Finish time: ____________   Date: ____________ Movements: ____________ Start time: ____________ Finish time: ____________  Date: ____________ Movements: ____________ Start time: ____________ Finish  time: ____________  Date: ____________ Movements: ____________ Start time: ____________ Finish time: ____________  Date: ____________ Movements: ____________ Start time:   ____________ Finish time: ____________  Date: ____________ Movements: ____________ Start time: ____________ Finish time: ____________  Date: ____________ Movements: ____________ Start time: ____________ Finish time: ____________  Date: ____________ Movements: ____________ Start time: ____________ Finish time: ____________   Date: ____________ Movements: ____________ Start time: ____________ Finish time: ____________  Date: ____________ Movements: ____________ Start time: ____________ Finish time: ____________  Date: ____________ Movements: ____________ Start time: ____________ Finish time: ____________  Date: ____________ Movements: ____________ Start time: ____________ Finish time: ____________  Date: ____________ Movements: ____________ Start time: ____________ Finish time: ____________  Date: ____________ Movements: ____________ Start time: ____________ Finish time: ____________  Date: ____________ Movements: ____________ Start time: ____________ Finish time: ____________   Date: ____________ Movements: ____________ Start time: ____________ Finish time: ____________  Date: ____________ Movements: ____________ Start time: ____________ Finish time: ____________  Date: ____________ Movements: ____________ Start time: ____________ Finish time: ____________  Date: ____________ Movements: ____________ Start time: ____________ Finish time: ____________  Date: ____________ Movements: ____________ Start time: ____________ Finish time: ____________  Date: ____________ Movements: ____________ Start time: ____________ Finish time: ____________  Date: ____________ Movements: ____________ Start time: ____________ Finish time: ____________   Date: ____________ Movements: ____________ Start time: ____________ Finish time: ____________  Date: ____________  Movements: ____________ Start time: ____________ Finish time: ____________  Date: ____________ Movements: ____________ Start time: ____________ Finish time: ____________  Date: ____________ Movements: ____________ Start time: ____________ Finish time: ____________  Date: ____________ Movements: ____________ Start time: ____________ Finish time: ____________  Date: ____________ Movements: ____________ Start time: ____________ Finish time: ____________  Date: ____________ Movements: ____________ Start time: ____________ Finish time: ____________   Date: ____________ Movements: ____________ Start time: ____________ Finish time: ____________  Date: ____________ Movements: ____________ Start time: ____________ Finish time: ____________  Date: ____________ Movements: ____________ Start time: ____________ Finish time: ____________  Date: ____________ Movements: ____________ Start time: ____________ Finish time: ____________  Date: ____________ Movements: ____________ Start time: ____________ Finish time: ____________  Date: ____________ Movements: ____________ Start time: ____________ Finish time: ____________     This information is not intended to replace advice given to you by your health care provider. Make sure you discuss any questions you have with your health care provider.     Document Released: 12/02/2006 Document Revised: 11/23/2014 Document Reviewed: 08/29/2012  Elsevier Interactive Patient Education ©2016 [employer]

## 2016-01-13 NOTE — MAU Provider Note (Signed)
Chief Complaint:  Decreased Fetal Movement   First Provider Initiated Contact with Patient 01/13/16 1243     HPI: Suzanne Ramirez is a 35 y.o. [id number] at [redacted]w[redacted]d who presents to maternity admissions reporting NRNST at [facility] today. Was performed for decreased FM.  Denies contractions, leakage of fluid or vaginal bleeding. Good fetal movement.   Pregnancy Course: A1 GDM. AMA  Past Medical History: Past Medical History  Diagnosis Date  . No pertinent past medical history   . Normal vaginal delivery     2007 and 2010  . Gallstones   . Gestational diabetes mellitus (GDM), antepartum     Past obstetric history: OB History  Gravida Para Term Preterm AB SAB TAB Ectopic Multiple Living  0 0 0 0 0  3    # Outcome Date GA Lbr Len/2nd Weight Sex Delivery Anes PTL Lv  4 Current           3 Term 12/06/12 [redacted]w[redacted]d 03:56 / 00:17 7 lb 15 oz (3.6 kg) M Vag-Spont EPI  Y  2 Term 2010 [redacted]w[redacted]d  7 lb (3.175 kg) M Vag-Spont   Y  1 Term 2007 [redacted]w[redacted]d  7 lb 9 oz (3.43 kg) F Vag-Spont   Y      Past Surgical History: Past Surgical History  Procedure Laterality Date  . Cholecystectomy    . Wisdom tooth extraction Bilateral 2013    upper  . No past surgeries    . Laparoscopic cholecystectomy single site with intraoperative cholangiogram N/A 12/04/2014    Procedure: LAPAROSCOPIC CHOLECYSTECTOMY SINGLE SITE WITH INTRAOPERATIVE CHOLANGIOGRAM;  Surgeon: Karie Soda, MD;  Location: [facility];  Service: General;  Laterality: N/A;     Family History: Family History  Problem Relation Age of Onset  . Other Neg Hx     Social History: Social History  Substance Use Topics  . Smoking status: Never Smoker   . Smokeless tobacco: Never Used  . Alcohol Use: No    Allergies: No Known Allergies  Meds:  Prescriptions prior to admission  Medication Sig Dispense Refill Last Dose  . ferrous sulfate 325 (65 FE) MG tablet Take 1 tablet (325 mg total) by mouth daily. 30 tablet 0 01/12/2016 at Unknown  time    I have reviewed patient's Past Medical Hx, Surgical Hx, Family Hx, Social Hx, medications and allergies.   ROS:  Review of Systems  GU: Pos for decreased FM. Neg for abd pain, VB, LOF, contractions.    Physical Exam  LMP 04/09/2015  Constitutional: Well-developed, well-nourished female in no acute distress.  Cardiovascular: normal rate Respiratory: normal effort GI: Abd soft, non-tender, gravid appropriate for gestational age.  Neurologic: Alert and oriented x 4.   FHT:  Baseline 140 , moderate variability, 10x10 accelerations present, no decelerations Contractions: Rare, mild   Labs: No results found for this or any previous visit (from the past 24 hour(s)).  Imaging:  BPP 8/8. AFI 12.9, vtx  MAU Course: BPP  MDM: 36.[redacted] weeks gestation w/ decreased FM, but reassuring fetal status from 8/8 BPP.  Assessment: 1. GDM, class A1   2. Non-stress test nonreactive     Plan: Discharge home in stable condition per consult w/ Dr. Mindi Slicker.  Labor precautions and fetal kick counts Follow-up Information    Follow up with [facility] On 01/16/2016.   Why:  Routine prenatal visit or sooner as needed if symptoms worsen   Contact information:   [address]  [address] [place] [address] [phone]       Follow up with [facility] [place] [facility]MISS[facility].   Why:  As needed in emergencies   Contact information:   [address] [id number] mc [place] [address] [phone]        Medication List    TAKE these medications        ferrous sulfate 325 (65 FE) MG tablet  Take 1 tablet (325 mg total) by mouth daily.        Vaishali [place], CNM 01/13/2016 1:45 PM

## 2016-01-28 ENCOUNTER — Telehealth (HOSPITAL_COMMUNITY): Payer: Self-pay | Admitting: *Deleted

## 2016-01-28 NOTE — Telephone Encounter (Signed)
Preadmission screen  

## 2016-01-31 ENCOUNTER — Encounter (HOSPITAL_COMMUNITY): Payer: Self-pay | Admitting: *Deleted

## 2016-02-05 ENCOUNTER — Encounter (HOSPITAL_COMMUNITY): Payer: Self-pay

## 2016-02-05 ENCOUNTER — Inpatient Hospital Stay (HOSPITAL_COMMUNITY): Payer: Medicaid Other | Admitting: Anesthesiology

## 2016-02-05 ENCOUNTER — Inpatient Hospital Stay (HOSPITAL_COMMUNITY)
Admission: RE | Admit: 2016-02-05 | Discharge: 2016-02-06 | DRG: 775 | Disposition: A | Discharge status: Home / Self Care | Payer: Medicaid Other | Source: Ambulatory Visit | Attending: Obstetrics and Gynecology | Admitting: Obstetrics and Gynecology

## 2016-02-05 DIAGNOSIS — O24420 Gestational diabetes mellitus in childbirth, diet controlled: Principal | ICD-10-CM | POA: Diagnosis present

## 2016-02-05 DIAGNOSIS — Z3A39 39 weeks gestation of pregnancy: Secondary | ICD-10-CM

## 2016-02-05 DIAGNOSIS — O9962 Diseases of the digestive system complicating childbirth: Secondary | ICD-10-CM | POA: Diagnosis present

## 2016-02-05 DIAGNOSIS — K8020 Calculus of gallbladder without cholecystitis without obstruction: Secondary | ICD-10-CM | POA: Diagnosis present

## 2016-02-05 DIAGNOSIS — O24419 Gestational diabetes mellitus in pregnancy, unspecified control: Secondary | ICD-10-CM | POA: Diagnosis present

## 2016-02-05 LAB — TYPE AND SCREEN
ABO/RH(D): AB POS
Antibody Screen: NEGATIVE

## 2016-02-05 LAB — CBC
HEMATOCRIT: 34.8 % — AB (ref 36.0–46.0)
Hemoglobin: 11.5 g/dL — ABNORMAL LOW (ref 12.0–15.0)
MCH: 24.4 pg — ABNORMAL LOW (ref 26.0–34.0)
MCHC: 33 g/dL (ref 30.0–36.0)
MCV: 73.7 fL — AB (ref 78.0–100.0)
Platelets: 216 10*3/uL (ref 150–400)
RBC: 4.72 MIL/uL (ref 3.87–5.11)
RDW: 14.7 % (ref 11.5–15.5)
WBC: 7.4 10*3/uL (ref 4.0–10.5)

## 2016-02-05 LAB — GLUCOSE, CAPILLARY: Glucose-Capillary: 72 mg/dL (ref 65–99)

## 2016-02-05 LAB — RPR: RPR Ser Ql: NONREACTIVE

## 2016-02-05 LAB — ABO/RH: ABO/RH(D): AB POS

## 2016-02-05 MED ORDER — TETANUS-DIPHTH-ACELL PERTUSSIS 5-2.5-18.5 LF-MCG/0.5 IM SUSP
0.5000 mL | Freq: Once | INTRAMUSCULAR | Status: DC
  Filled 2016-02-05: qty 0.5

## 2016-02-05 MED ORDER — DIPHENHYDRAMINE HCL 25 MG PO CAPS: 25.0000 mg | ORAL_CAPSULE | Freq: Four times a day (QID) | ORAL | Status: DC | PRN

## 2016-02-05 MED ORDER — PHENYLEPHRINE 40 MCG/ML (10ML) SYRINGE FOR IV PUSH (FOR BLOOD PRESSURE SUPPORT): 80.0000 ug | PREFILLED_SYRINGE | INTRAVENOUS | Status: DC | PRN

## 2016-02-05 MED ORDER — LIDOCAINE HCL (PF) 1 % IJ SOLN: 30.0000 mL | INTRAMUSCULAR | Status: DC | PRN

## 2016-02-05 MED ORDER — LANOLIN HYDROUS EX OINT
TOPICAL_OINTMENT | CUTANEOUS | Status: DC | PRN
Start: 2016-02-05 — End: 2016-02-06

## 2016-02-05 MED ORDER — OXYTOCIN 10 UNIT/ML IJ SOLN
2.5000 [IU]/h | INTRAVENOUS | Status: DC
  Administered 2016-02-05: 2.5 [IU]/h via INTRAVENOUS

## 2016-02-05 MED ORDER — EPHEDRINE 5 MG/ML INJ: 10.0000 mg | INTRAVENOUS | Status: DC | PRN

## 2016-02-05 MED ORDER — MAGNESIUM HYDROXIDE 400 MG/5ML PO SUSP: 30.0000 mL | ORAL | Status: DC | PRN

## 2016-02-05 MED ORDER — ONDANSETRON HCL 4 MG PO TABS: 4.0000 mg | ORAL_TABLET | ORAL | Status: DC | PRN

## 2016-02-05 MED ORDER — BUTORPHANOL TARTRATE 1 MG/ML IJ SOLN: 1.0000 mg | INTRAMUSCULAR | Status: DC | PRN

## 2016-02-05 MED ORDER — SIMETHICONE 80 MG PO CHEW: 80.0000 mg | CHEWABLE_TABLET | ORAL | Status: DC | PRN

## 2016-02-05 MED ORDER — PHENYLEPHRINE 40 MCG/ML (10ML) SYRINGE FOR IV PUSH (FOR BLOOD PRESSURE SUPPORT)
80.0000 ug | PREFILLED_SYRINGE | INTRAVENOUS | Status: DC | PRN
Start: 2016-02-05 — End: 2016-02-05
  Filled 2016-02-05: qty 20

## 2016-02-05 MED ORDER — ZOLPIDEM TARTRATE 5 MG PO TABS: 5.0000 mg | ORAL_TABLET | Freq: Every evening | ORAL | Status: DC | PRN

## 2016-02-05 MED ORDER — CITRIC ACID-SODIUM CITRATE 334-500 MG/5ML PO SOLN
30.0000 mL | ORAL | Status: DC | PRN
Start: 2016-02-05 — End: 2016-02-05

## 2016-02-05 MED ORDER — ONDANSETRON HCL 4 MG/2ML IJ SOLN: 4.0000 mg | Freq: Four times a day (QID) | INTRAMUSCULAR | Status: DC | PRN

## 2016-02-05 MED ORDER — ACETAMINOPHEN 325 MG PO TABS: 650.0000 mg | ORAL_TABLET | ORAL | Status: DC | PRN

## 2016-02-05 MED ORDER — SENNOSIDES-DOCUSATE SODIUM 8.6-50 MG PO TABS
2.0000 | ORAL_TABLET | ORAL | Status: DC
  Administered 2016-02-05: 2 via ORAL
  Filled 2016-02-05: qty 2

## 2016-02-05 MED ORDER — MEASLES, MUMPS & RUBELLA VAC ~~LOC~~ INJ: 0.5000 mL | INJECTION | Freq: Once | SUBCUTANEOUS | Status: DC

## 2016-02-05 MED ORDER — OXYCODONE-ACETAMINOPHEN 5-325 MG PO TABS: 1.0000 | ORAL_TABLET | ORAL | Status: DC | PRN

## 2016-02-05 MED ORDER — LACTATED RINGERS IV SOLN: 500.0000 mL | INTRAVENOUS | Status: DC | PRN

## 2016-02-05 MED ORDER — PRENATAL MULTIVITAMIN CH
1.0000 | ORAL_TABLET | Freq: Every day | ORAL | Status: DC
  Administered 2016-02-06: 1 via ORAL
  Filled 2016-02-05: qty 1

## 2016-02-05 MED ORDER — EPHEDRINE 5 MG/ML INJ
10.0000 mg | INTRAVENOUS | Status: DC | PRN
Start: 2016-02-05 — End: 2016-02-05

## 2016-02-05 MED ORDER — LACTATED RINGERS IV SOLN: 500.0000 mL | Freq: Once | INTRAVENOUS | Status: DC

## 2016-02-05 MED ORDER — OXYTOCIN 10 UNIT/ML IJ SOLN
1.0000 m[IU]/min | INTRAVENOUS | Status: DC
  Administered 2016-02-05: 2 m[IU]/min via INTRAVENOUS
  Filled 2016-02-05: qty 10

## 2016-02-05 MED ORDER — OXYCODONE-ACETAMINOPHEN 5-325 MG PO TABS: 2.0000 | ORAL_TABLET | ORAL | Status: DC | PRN

## 2016-02-05 MED ORDER — ONDANSETRON HCL 4 MG/2ML IJ SOLN: 4.0000 mg | INTRAMUSCULAR | Status: DC | PRN

## 2016-02-05 MED ORDER — METHYLERGONOVINE MALEATE 0.2 MG/ML IJ SOLN: 0.2000 mg | INTRAMUSCULAR | Status: DC | PRN

## 2016-02-05 MED ORDER — WITCH HAZEL-GLYCERIN EX PADS: 1.0000 "application " | MEDICATED_PAD | CUTANEOUS | Status: DC | PRN

## 2016-02-05 MED ORDER — LIDOCAINE HCL (PF) 1 % IJ SOLN
INTRAMUSCULAR | Status: DC | PRN
  Administered 2016-02-05 (×2): 7 mL

## 2016-02-05 MED ORDER — TERBUTALINE SULFATE 1 MG/ML IJ SOLN: 0.2500 mg | Freq: Once | INTRAMUSCULAR | Status: DC | PRN

## 2016-02-05 MED ORDER — METHYLERGONOVINE MALEATE 0.2 MG PO TABS: 0.2000 mg | ORAL_TABLET | ORAL | Status: DC | PRN

## 2016-02-05 MED ORDER — BENZOCAINE-MENTHOL 20-0.5 % EX AERO
1.0000 "application " | INHALATION_SPRAY | CUTANEOUS | Status: DC | PRN
  Filled 2016-02-05 (×2): qty 56

## 2016-02-05 MED ORDER — OXYTOCIN BOLUS FROM INFUSION
500.0000 mL | INTRAVENOUS | Status: DC
  Administered 2016-02-05: 500 mL via INTRAVENOUS

## 2016-02-05 MED ORDER — IBUPROFEN 600 MG PO TABS
600.0000 mg | ORAL_TABLET | Freq: Four times a day (QID) | ORAL | Status: DC
  Administered 2016-02-05 – 2016-02-06 (×4): 600 mg via ORAL
  Filled 2016-02-05 (×4): qty 1

## 2016-02-05 MED ORDER — OXYCODONE HCL 5 MG PO TABS
5.0000 mg | ORAL_TABLET | ORAL | Status: DC | PRN
  Administered 2016-02-05: 5 mg via ORAL
  Filled 2016-02-05: qty 1

## 2016-02-05 MED ORDER — DIBUCAINE 1 % RE OINT
1.0000 "application " | TOPICAL_OINTMENT | RECTAL | Status: DC | PRN
  Filled 2016-02-05: qty 28

## 2016-02-05 MED ORDER — LACTATED RINGERS IV SOLN: INTRAVENOUS | Status: DC

## 2016-02-05 MED ORDER — FENTANYL 2.5 MCG/ML BUPIVACAINE 1/10 % EPIDURAL INFUSION (WH - ANES)
14.0000 mL/h | INTRAMUSCULAR | Status: DC | PRN
  Administered 2016-02-05: 14 mL/h via EPIDURAL
  Filled 2016-02-05: qty 125

## 2016-02-05 MED ORDER — DIPHENHYDRAMINE HCL 50 MG/ML IJ SOLN: 12.5000 mg | INTRAMUSCULAR | Status: DC | PRN

## 2016-02-05 MED ORDER — OXYCODONE HCL 5 MG PO TABS: 10.0000 mg | ORAL_TABLET | ORAL | Status: DC | PRN

## 2016-02-05 NOTE — Anesthesia Procedure Notes (Signed)
Epidural Patient location during procedure: OB Start time: 02/05/2016 11:24 AM End time: 02/05/2016 11:28 AM  Staffing Anesthesiologist: Leilani AbleHATCHETT, Aldred Mase Performed by: anesthesiologist   Preanesthetic Checklist Completed: patient identified, surgical consent, pre-op evaluation, timeout performed, IV checked, risks and benefits discussed and monitors and equipment checked  Epidural Patient position: sitting Prep: site prepped and draped and DuraPrep Patient monitoring: continuous pulse ox and blood pressure Approach: midline Location: L3-L4 Injection technique: LOR air  Needle:  Needle type: Tuohy  Needle gauge: 17 G Needle length: 9 cm and 9 Needle insertion depth: 5 cm cm Catheter type: closed end flexible Catheter size: 19 Gauge Catheter at skin depth: 10 cm Test dose: negative and Other  Assessment Sensory level: T9 Events: blood not aspirated, injection not painful, no injection resistance, negative IV test and no paresthesia  Additional Notes Reason for block:procedure for pain

## 2016-02-05 NOTE — Lactation Note (Signed)
This note was copied from a baby's chart. Lactation Consultation Note  Patient Name: Suzanne Ramirez MVHQI'OToday's Date: 02/05/2016 Reason for consult: Initial assessment Baby at 7 hr of life and mom reports baby is latching well but she does not have milk yet. Demonstrated manual expression, no colostrum noted bilaterally. She has cups and spoons to offer formula. Mom offered all of her babies breast and formula. Her oldest latched well but her 2 middle boys did not so she pumped for them. Discussed baby behavior, feeding frequency, pumping, baby belly size, voids, wt loss, breast changes, and nipple care. Given lactation handouts. Aware of OP services and support group.     Maternal Data Has patient been taught Hand Expression?: Yes Does the patient have breastfeeding experience prior to this delivery?: Yes  Feeding Feeding Type: Formula  LATCH Score/Interventions                      Lactation Tools Discussed/Used WIC Program: No   Consult Status Consult Status: Follow-up Date: 02/06/16 Follow-up type: In-patient    [name] 02/05/2016, 7:52 PM

## 2016-02-05 NOTE — H&P (Signed)
Suzanne Ramirez is a 35 y.o. female, G4 P3003, EGA 39+ weeks with EDC 3-26 presenting for elective induction.  Prenatal care complicated by A1GDM, AMA with low risk Panorama.  Maternal Medical History:  Fetal activity: Perceived fetal activity is normal.    Prenatal Complications - Diabetes: gestational. Diabetes is managed by diet.      OB History    Gravida Para Term Preterm AB TAB SAB Ectopic Multiple Living   0 0 0 0 0  3    SVD x3  Past Medical History  Diagnosis Date  . No pertinent past medical history   . Normal vaginal delivery     2007 and 2010  . Gallstones   . Gestational diabetes mellitus (GDM), antepartum   . Gestational diabetes     diet controlled   Past Surgical History  Procedure Laterality Date  . Cholecystectomy    . Wisdom tooth extraction Bilateral 2013    upper  . No past surgeries    . Laparoscopic cholecystectomy single site with intraoperative cholangiogram N/A 12/04/2014    Procedure: LAPAROSCOPIC CHOLECYSTECTOMY SINGLE SITE WITH INTRAOPERATIVE CHOLANGIOGRAM;  Surgeon: Karie Soda, MD;  Location: [facility];  Service: General;  Laterality: N/A;   Family History: family history is negative for Other, Alcohol abuse, Arthritis, Asthma, Birth defects, Cancer, COPD, Depression, Diabetes, Drug abuse, Early death, Hearing loss, Heart disease, Hyperlipidemia, Hypertension, Kidney disease, Learning disabilities, Mental illness, Mental retardation, Miscarriages / Stillbirths, Stroke, Vision loss, and Varicose Veins. Social History:  reports that she has never smoked. She has never used smokeless tobacco. She reports that she does not drink alcohol or use illicit drugs.   Prenatal Transfer Tool  Maternal Diabetes: Yes:  Diabetes Type:  Diet controlled Genetic Screening: Normal Maternal Ultrasounds/Referrals: Normal Fetal Ultrasounds or other Referrals:  None Maternal Substance Abuse:  No Significant Maternal Medications:  None Significant Maternal  Lab Results:  Lab values include: Group B Strep negative Other Comments:  None  Review of Systems  Respiratory: Negative.   Cardiovascular: Negative.     Dilation: 4 Effacement (%): 90 Station: -1 Exam by:: dr Phelix Fudala Blood pressure 96/72, pulse 98, resp. rate 18, height  (1.626 m), weight 81.647 kg (180 lb), last menstrual period 04/09/2015. Maternal Exam:  Uterine Assessment: Contraction strength is mild.  Contraction frequency is irregular.   Abdomen: Patient reports no abdominal tenderness. Estimated fetal weight is 7 1/2 lbs.   Fetal presentation: vertex  Introitus: Normal vulva. Amniotic fluid character: clear.  Pelvis: adequate for delivery.   Cervix: Cervix evaluated by digital exam.     Fetal Exam Fetal Monitor Review: Mode: ultrasound.   Baseline rate: 140.  Variability: moderate (6-25 bpm).   Pattern: accelerations present and no decelerations.    Fetal State Assessment: Category I - tracings are normal.     Physical Exam  Vitals reviewed. Constitutional: She appears well-developed and well-nourished.  Cardiovascular: Normal rate, regular rhythm and normal heart sounds.   No murmur heard. Respiratory: Effort normal and breath sounds normal. No respiratory distress. She has no wheezes.  GI: Soft.    Prenatal labs: ABO, Rh: --/--/AB POS (03/22 0740) Antibody: NEG (03/22 0740) Rubella: Immune (08/22 0000) RPR: Nonreactive (08/22 0000)  HBsAg: Negative (08/22 0000)  HIV: Non-reactive (08/22 0000)  GBS: Negative (02/22 0000)   Assessment/Plan: IUP at 39+ weeks with A1GDM, AMA, favorable cervix for induction.  On pitocin, AROM done, monitor progress.  Will check one CBG to make sure it is  normal.     Evalin Shawhan D 02/05/2016, 9:51 AM

## 2016-02-05 NOTE — Anesthesia Postprocedure Evaluation (Signed)
Anesthesia Post Note  Patient: Suzanne Ramirez  Procedure(s) Performed: * No procedures listed *  Patient location during evaluation: Mother Baby Anesthesia Type: Epidural Level of consciousness: awake and alert Pain management: pain level controlled Vital Signs Assessment: post-procedure vital signs reviewed and stable Respiratory status: spontaneous breathing Cardiovascular status: stable Postop Assessment: no headache, adequate PO intake, no backache, patient able to bend at knees, epidural receding and no signs of nausea or vomiting Anesthetic complications: no    Last Vitals:  Filed Vitals:   02/05/16 1443 02/05/16 1615  BP: 111/50 102/45  Pulse: 98 95  Temp: 36.8 C 36.8 C  Resp: 18 18    Last Pain:  Filed Vitals:   02/05/16 1628  PainSc: 3                  Donnia Poplaski, Weldon Pickingharlesetta Marie

## 2016-02-05 NOTE — Anesthesia Preprocedure Evaluation (Signed)
Anesthesia Evaluation  Patient identified by MRN, date of birth, ID band Patient awake    Reviewed: Allergy & Precautions, H&P , NPO status , Patient's Chart, lab work & pertinent test results  Airway Mallampati: I  TM Distance: >3 FB Neck ROM: full    Dental no notable dental hx.    Pulmonary neg pulmonary ROS,    Pulmonary exam normal breath sounds clear to auscultation       Cardiovascular negative cardio ROS Normal cardiovascular exam     Neuro/Psych negative neurological ROS  negative psych ROS   GI/Hepatic negative GI ROS, Neg liver ROS,   Endo/Other  diabetes, Gestational  Renal/GU negative Renal ROS     Musculoskeletal   Abdominal Normal abdominal exam  (+)   Peds  Hematology negative hematology ROS (+)   Anesthesia Other Findings   Reproductive/Obstetrics (+) Pregnancy                             Anesthesia Physical Anesthesia Plan  ASA: II  Anesthesia Plan: Epidural   Post-op Pain Management:    Induction:   Airway Management Planned:   Additional Equipment:   Intra-op Plan:   Post-operative Plan:   Informed Consent: I have reviewed the patients History and Physical, chart, labs and discussed the procedure including the risks, benefits and alternatives for the proposed anesthesia with the patient or authorized representative who has indicated his/her understanding and acceptance.     Plan Discussed with:   Anesthesia Plan Comments:         Anesthesia Quick Evaluation

## 2016-02-06 MED ORDER — IBUPROFEN 600 MG PO TABS: 600.0000 mg | ORAL_TABLET | Freq: Four times a day (QID) | ORAL | Status: DC

## 2016-02-06 NOTE — Lactation Note (Signed)
This note was copied from a baby's chart. Lactation Consultation Note  Patient Name: [name] WGNFA'OToday's Date: 02/06/2016 Reason for consult: Follow-up assessment;Other (Comment) (per mom breast / and bottle with formula )  Baby is 5726 hours old and per mom may be going home if approved by Dr. Karilyn CotaGosrani.  Per mom my nipples are sore and I've been breast feeding and bottle feeding.  LC reviewed supply and demand. LC recommended since mom has started to supplement and baby may be used to it  Prior to latching she may have to give the baby a small appetizer and then latch so the baby isn't has vigorous at the breast.  Also offer both breast prior to supplementing at feeding.  Sore nipple and engorgement prevention and tx reviewed.  Per mom has a pump at home . LC recommended adding post pumping for 10 mins both breast for extra stimulation so fatty milk  Comes in quicker. Mother informed of post-discharge support and given phone number to the lactation department, including services for phone call assistance; out-patient appointments; and breastfeeding support group. List of other breastfeeding resources in the community given in the handout. Encouraged mother to call for problems or concerns related to breastfeeding.    Maternal Data    Feeding Feeding Type:  (baby recently fed , presently not showing feeding cues )  LATCH Score/Interventions                Intervention(s): Breastfeeding basics reviewed (see LC note )     Lactation Tools Discussed/Used     Consult Status Consult Status: Complete Date: 02/06/16    [name], Suzanne Quilter Ann 02/06/2016, 2:39 PM

## 2016-02-06 NOTE — Discharge Summary (Signed)
OB Discharge Summary     Patient Name: Suzanne Ramirez DOB: Jun 12, 1981 MRN: [id number]  Date of admission: 02/05/2016 Delivering MD: Jackelyn Knife, Elleah Hemsley   Date of discharge: 02/06/2016  Admitting diagnosis: INDUCTION Intrauterine pregnancy: [redacted]w[redacted]d     Secondary diagnosis:  Active Problems:   Gestational diabetes mellitus in third trimester      Discharge diagnosis: Term Pregnancy Delivered and GDM A1                                                                                                 Augmentation: AROM and Pitocin  Complications: None  Hospital course:  Induction of Labor With Vaginal Delivery   35 y.o. yo [id number] at [redacted]w[redacted]d was admitted to the hospital 02/05/2016 for induction of labor.  Indication for induction: Favorable cervix at term and A1 DM.  Patient had an uncomplicated labor course as follows: Membrane Rupture Time/Date: 9:48 AM ,02/05/2016   Intrapartum Procedures: Episiotomy: None [1]                                         Lacerations:  2nd degree [3]  Patient had delivery of a Viable infant.  Information for the patient's newborn:  Sergio, [place] [[id number]]  Delivery Method: Vaginal, Spontaneous Delivery (Filed from Delivery Summary)   02/05/2016  Details of delivery can be found in separate delivery note.  Patient had a routine postpartum course. Patient is discharged home 02/06/2016.   Physical exam  Filed Vitals:   02/05/16 1443 02/05/16 1615 02/05/16 2103 02/06/16 0454  BP: 111/50 102/45 108/60 105/56  Pulse: 98 95 88 88  Temp: 98.3 F (36.8 C) 98.3 F (36.8 C) 98.6 F (37 C) 97.6 F (36.4 C)  TempSrc: Oral Oral Oral Oral  Resp: Height:      Weight:      SpO2: 100% 99%     General: alert Lochia: appropriate Uterine Fundus: firm  Labs: Lab Results  Component Value Date   WBC 7.4 02/05/2016   HGB 11.5* 02/05/2016   HCT 34.8* 02/05/2016   MCV 73.7* 02/05/2016   PLT 216 02/05/2016   CMP Latest Ref Rng 12/02/2015   Glucose 65 - 99 mg/dL 82  BUN 6 - 20 mg/dL <1(H)  Creatinine 0.86 - 1.00 mg/dL 5.78(I)  Sodium 696 - 295 mmol/L 139  Potassium 3.5 - 5.1 mmol/L 3.7  Chloride 101 - 111 mmol/L 103  CO2 22 - 32 mmol/L -  Calcium 8.9 - 10.3 mg/dL -  Total Protein 6.5 - 8.1 g/dL -  Total Bilirubin 0.3 - 1.2 mg/dL -  Alkaline Phos 38 - 284 U/L -  AST 15 - 41 U/L -  ALT 14 - 54 U/L -    Discharge instruction: per After Visit Summary and "Baby and Me Booklet".  After visit meds:    Medication List    TAKE these medications        acetaminophen 500 MG tablet  Commonly known as:  TYLENOL  Take 1,000 mg by mouth every 6 (six) hours as needed for mild pain.     ferrous sulfate 325 (65 FE) MG tablet  Take 1 tablet (325 mg total) by mouth daily.     ibuprofen 600 MG tablet  Commonly known as:  ADVIL,MOTRIN  Take 1 tablet (600 mg total) by mouth every 6 (six) hours.        Diet: routine diet  Activity: Advance as tolerated. Pelvic rest for 6 weeks.   Outpatient follow up:6 weeks   Newborn Data: Live born female  Birth Weight: 7 lb 15.2 oz (3606 g) APGAR: 8, 9  Baby Feeding: Breast Disposition:home with mother   02/06/2016 Zenaida NieceMEISINGER,Aerica Rincon D, MD

## 2016-02-06 NOTE — Progress Notes (Signed)
PPD #1 No problems, wants to go home today Afeb, VSS Fundus firm, NT at U-1 Continue routine postpartum care, d/c home this pm  

## 2016-02-06 NOTE — Discharge Instructions (Signed)
As per discharge pamphlet °

## 2016-02-06 NOTE — Progress Notes (Signed)
UR chart review completed.  

## 2016-02-10 ENCOUNTER — Emergency Department (HOSPITAL_COMMUNITY): Payer: Medicaid Other

## 2016-02-10 ENCOUNTER — Inpatient Hospital Stay (HOSPITAL_COMMUNITY)
Admission: EM | Admit: 2016-02-10 | Discharge: 2016-02-11 | DRG: 776 | Disposition: A | Discharge status: Home / Self Care | Payer: Medicaid Other | Attending: Obstetrics and Gynecology | Admitting: Obstetrics and Gynecology

## 2016-02-10 ENCOUNTER — Encounter (HOSPITAL_COMMUNITY): Payer: Self-pay | Admitting: Emergency Medicine

## 2016-02-10 DIAGNOSIS — J810 Acute pulmonary edema: Secondary | ICD-10-CM

## 2016-02-10 DIAGNOSIS — R0602 Shortness of breath: Secondary | ICD-10-CM | POA: Diagnosis not present

## 2016-02-10 DIAGNOSIS — I10 Essential (primary) hypertension: Secondary | ICD-10-CM

## 2016-02-10 DIAGNOSIS — O9953 Diseases of the respiratory system complicating the puerperium: Secondary | ICD-10-CM | POA: Diagnosis present

## 2016-02-10 DIAGNOSIS — O1495 Unspecified pre-eclampsia, complicating the puerperium: Principal | ICD-10-CM | POA: Diagnosis present

## 2016-02-10 DIAGNOSIS — J811 Chronic pulmonary edema: Secondary | ICD-10-CM | POA: Diagnosis present

## 2016-02-10 DIAGNOSIS — R079 Chest pain, unspecified: Secondary | ICD-10-CM

## 2016-02-10 LAB — BASIC METABOLIC PANEL
Anion gap: 9 (ref 5–15)
BUN: 17 mg/dL (ref 6–20)
CHLORIDE: 111 mmol/L (ref 101–111)
CO2: 22 mmol/L (ref 22–32)
Calcium: 8.6 mg/dL — ABNORMAL LOW (ref 8.9–10.3)
Creatinine, Ser: 0.58 mg/dL (ref 0.44–1.00)
GFR calc Af Amer: >60 mL/min (ref >60)
GFR calc non Af Amer: >60 mL/min (ref >60)
GLUCOSE: 91 mg/dL (ref 65–99)
POTASSIUM: 4.1 mmol/L (ref 3.5–5.1)
Sodium: 142 mmol/L (ref 135–145)

## 2016-02-10 LAB — URINALYSIS, ROUTINE W REFLEX MICROSCOPIC
BILIRUBIN URINE: NEGATIVE
Glucose, UA: NEGATIVE mg/dL
Ketones, ur: NEGATIVE mg/dL
Nitrite: NEGATIVE
PROTEIN: NEGATIVE mg/dL
Specific Gravity, Urine: 1.011 (ref 1.005–1.030)
pH: 6 (ref 5.0–8.0)

## 2016-02-10 LAB — CBC
HEMATOCRIT: 33.7 % — AB (ref 36.0–46.0)
Hemoglobin: 11.1 g/dL — ABNORMAL LOW (ref 12.0–15.0)
MCH: 24.7 pg — AB (ref 26.0–34.0)
MCHC: 32.9 g/dL (ref 30.0–36.0)
MCV: 74.9 fL — AB (ref 78.0–100.0)
Platelets: 272 10*3/uL (ref 150–400)
RBC: 4.5 MIL/uL (ref 3.87–5.11)
RDW: 14.1 % (ref 11.5–15.5)
WBC: 6.8 10*3/uL (ref 4.0–10.5)

## 2016-02-10 LAB — URINE MICROSCOPIC-ADD ON

## 2016-02-10 LAB — I-STAT TROPONIN, ED
Troponin i, poc: 0 ng/mL (ref 0.00–0.08)
Troponin i, poc: 0.04 ng/mL (ref 0.00–0.08)

## 2016-02-10 MED ORDER — MORPHINE SULFATE (PF) 4 MG/ML IV SOLN
4.0000 mg | Freq: Once | INTRAVENOUS | Status: DC
  Filled 2016-02-10: qty 1

## 2016-02-10 MED ORDER — IOPAMIDOL (ISOVUE-370) INJECTION 76%
100.0000 mL | Freq: Once | INTRAVENOUS | Status: AC | PRN
  Administered 2016-02-10: 100 mL via INTRAVENOUS

## 2016-02-10 MED ORDER — SODIUM CHLORIDE 0.9 % IV BOLUS (SEPSIS)
1000.0000 mL | Freq: Once | INTRAVENOUS | Status: AC
  Administered 2016-02-10: 1000 mL via INTRAVENOUS

## 2016-02-10 MED ORDER — NIFEDIPINE ER OSMOTIC RELEASE 30 MG PO TB24: 30.0000 mg | ORAL_TABLET | Freq: Two times a day (BID) | ORAL | Status: AC

## 2016-02-10 MED ORDER — FUROSEMIDE 10 MG/ML IJ SOLN
20.0000 mg | INTRAMUSCULAR | Status: DC | PRN
  Administered 2016-02-10 – 2016-02-11 (×2): 20 mg via INTRAVENOUS
  Filled 2016-02-10 (×3): qty 2

## 2016-02-10 MED ORDER — MORPHINE SULFATE (PF) 4 MG/ML IV SOLN
4.0000 mg | Freq: Once | INTRAVENOUS | Status: AC
  Administered 2016-02-10: 4 mg via INTRAVENOUS
  Filled 2016-02-10: qty 1

## 2016-02-10 MED ORDER — HYDRALAZINE HCL 20 MG/ML IJ SOLN
10.0000 mg | Freq: Once | INTRAMUSCULAR | Status: DC | PRN
Start: 2016-02-10 — End: 2016-02-11

## 2016-02-10 MED ORDER — ONDANSETRON HCL 4 MG/2ML IJ SOLN
4.0000 mg | Freq: Once | INTRAMUSCULAR | Status: AC
Start: 2016-02-10 — End: 2016-02-10
  Administered 2016-02-10: 4 mg via INTRAVENOUS
  Filled 2016-02-10: qty 2

## 2016-02-10 MED ORDER — OXYCODONE-ACETAMINOPHEN 5-325 MG PO TABS
2.0000 | ORAL_TABLET | Freq: Four times a day (QID) | ORAL | Status: DC | PRN
  Administered 2016-02-10 – 2016-02-11 (×2): 2 via ORAL
  Filled 2016-02-10 (×2): qty 2

## 2016-02-10 MED ORDER — LABETALOL HCL 5 MG/ML IV SOLN: 20.0000 mg | INTRAVENOUS | Status: DC | PRN

## 2016-02-10 MED ORDER — ONDANSETRON HCL 4 MG/2ML IJ SOLN: 4.0000 mg | Freq: Four times a day (QID) | INTRAMUSCULAR | Status: DC

## 2016-02-10 MED ORDER — ONDANSETRON HCL 4 MG/2ML IJ SOLN
4.0000 mg | Freq: Once | INTRAMUSCULAR | Status: DC
  Filled 2016-02-10: qty 2

## 2016-02-10 MED ORDER — LABETALOL HCL 5 MG/ML IV SOLN
20.0000 mg | Freq: Once | INTRAVENOUS | Status: AC
  Administered 2016-02-10: 20 mg via INTRAVENOUS
  Filled 2016-02-10: qty 4

## 2016-02-10 MED ORDER — FUROSEMIDE 10 MG/ML IJ SOLN
20.0000 mg | Freq: Once | INTRAMUSCULAR | Status: AC
Start: 2016-02-10 — End: 2016-02-10
  Administered 2016-02-10: 20 mg via INTRAVENOUS
  Filled 2016-02-10: qty 4

## 2016-02-10 NOTE — Progress Notes (Signed)
Verified her pcp as Ralene Okoy Moreira

## 2016-02-10 NOTE — H&P (Signed)
Suzanne Ramirez is an 35 y.o. female G4P4 s/p NSVD 02/05/16 and developed chest pain and SOB.  Reported to [facility] ED and found to have elevated BP to 150-180/80-90.  Had a workup including normal labs, CXR WNL, EKG and troponin negative.  Chest CT with no PE but did show signs of fluid overlload and pulmonary edema.  No HA or scotomata, just CP and SOB.  Urine negative for protein.  Pt given IV labetalol at ED and responded well to that.  Also begun on IV lasix for diuresis.   Pertinent Gynecological History: MOB History:NSVD x 4   Menstrual History:  No LMP recorded.    Past Medical History  Diagnosis Date  . No pertinent past medical history   . Normal vaginal delivery     2007 and 2010  . Gallstones   . Gestational diabetes mellitus (GDM), antepartum   . Gestational diabetes     diet controlled    Past Surgical History  Procedure Laterality Date  . Cholecystectomy    . Wisdom tooth extraction Bilateral 2013    upper  . No past surgeries    . Laparoscopic cholecystectomy single site with intraoperative cholangiogram N/A 12/04/2014    Procedure: LAPAROSCOPIC CHOLECYSTECTOMY SINGLE SITE WITH INTRAOPERATIVE CHOLANGIOGRAM;  Surgeon: Karie Soda, MD;  Location: [facility];  Service: General;  Laterality: N/A;    Family History  Problem Relation Age of Onset  . Other Neg Hx   . Alcohol abuse Neg Hx   . Arthritis Neg Hx   . Asthma Neg Hx   . Birth defects Neg Hx   . Cancer Neg Hx   . COPD Neg Hx   . Depression Neg Hx   . Diabetes Neg Hx   . Drug abuse Neg Hx   . Early death Neg Hx   . Hearing loss Neg Hx   . Heart disease Neg Hx   . Hyperlipidemia Neg Hx   . Hypertension Neg Hx   . Kidney disease Neg Hx   . Learning disabilities Neg Hx   . Mental illness Neg Hx   . Mental retardation Neg Hx   . Miscarriages / Stillbirths Neg Hx   . Stroke Neg Hx   . Vision loss Neg Hx   . Varicose Veins Neg Hx     Social History:  reports that she has never smoked. She has never used  smokeless tobacco. She reports that she does not drink alcohol or use illicit drugs.  Allergies: No Known Allergies  Prescriptions prior to admission  Medication Sig Dispense Refill Last Dose  . acetaminophen (TYLENOL) 500 MG tablet Take 1,000 mg by mouth every 6 (six) hours as needed for mild pain.   2 weeks  . ferrous sulfate 325 (65 FE) MG tablet Take 1 tablet (325 mg total) by mouth daily. 30 tablet 0 Past Week at Unknown time  . ibuprofen (ADVIL,MOTRIN) 600 MG tablet Take 1 tablet (600 mg total) by mouth every 6 (six) hours. 30 tablet 0 02/09/2016 at Unknown time    Review of Systems  Respiratory: Positive for shortness of breath.     Blood pressure 141/71, pulse 73, temperature 98.7 F (37.1 C), temperature source Oral, resp. rate 20, SpO2 97 %, currently breastfeeding. Physical Exam  Constitutional: She appears well-developed.  Cardiovascular: Normal rate.   Respiratory: Effort normal.  On 2L [place] O2  GI: Soft.  Genitourinary: Vagina normal.  Neurological: She is alert.  Psychiatric: She has a normal mood and affect.  Results for orders placed or performed during the hospital encounter of 02/10/16 (from the past 24 hour(s))  Basic metabolic panel     Status: Abnormal   Collection Time: 02/10/16 11:55 AM  Result Value Ref Range   Sodium 142 135 - 145 mmol/L   Potassium 4.1 3.5 - 5.1 mmol/L   Chloride 111 101 - 111 mmol/L   CO2 22 22 - 32 mmol/L   Glucose, Bld 91 65 - 99 mg/dL   BUN 17 6 - 20 mg/dL   Creatinine, Ser 4.09 0.44 - 1.00 mg/dL   Calcium 8.6 (L) 8.9 - 10.3 mg/dL   GFR calc non Af Amer >60 >60 mL/min   GFR calc Af Amer >60 >60 mL/min   Anion gap 9 5 - 15  CBC     Status: Abnormal   Collection Time: 02/10/16 11:55 AM  Result Value Ref Range   WBC 6.8 4.0 - 10.5 K/uL   RBC 4.50 3.87 - 5.11 MIL/uL   Hemoglobin 11.1 (L) 12.0 - 15.0 g/dL   HCT 81.1 (L) 91.4 - 78.2 %   MCV 74.9 (L) 78.0 - 100.0 fL   MCH 24.7 (L) 26.0 - 34.0 pg   MCHC 32.9 30.0 - 36.0 g/dL    RDW 95.6 21.3 - 08.6 %   Platelets 272 150 - 400 K/uL  I-stat troponin, ED (not at [facility])     Status: None   Collection Time: 02/10/16 12:14 PM  Result Value Ref Range   Troponin i, poc 0.00 0.00 - 0.08 ng/mL   Comment 3          I-Stat Troponin, ED (not at [facility])     Status: None   Collection Time: 02/10/16  4:25 PM  Result Value Ref Range   Troponin i, poc 0.04 0.00 - 0.08 ng/mL   Comment 3          Urinalysis, Routine w reflex microscopic (not at [facility])     Status: Abnormal   Collection Time: 02/10/16  4:45 PM  Result Value Ref Range   Color, Urine YELLOW YELLOW   APPearance CLEAR CLEAR   Specific Gravity, Urine 1.011 1.005 - 1.030   pH 6.0 5.0 - 8.0   Glucose, UA NEGATIVE NEGATIVE mg/dL   Hgb urine dipstick MODERATE (A) NEGATIVE   Bilirubin Urine NEGATIVE NEGATIVE   Ketones, ur NEGATIVE NEGATIVE mg/dL   Protein, ur NEGATIVE NEGATIVE mg/dL   Nitrite NEGATIVE NEGATIVE   Leukocytes, UA SMALL (A) NEGATIVE  Urine microscopic-add on     Status: Abnormal   Collection Time: 02/10/16  4:45 PM  Result Value Ref Range   Squamous Epithelial / LPF 0-5 (A) NONE SEEN   WBC, UA 0-5 0 - 5 WBC/hpf   RBC / HPF 0-5 0 - 5 RBC/hpf   Bacteria, UA RARE (A) NONE SEEN    Dg Chest 2 View  02/10/2016  CLINICAL DATA:  Shortness of breath ; chest pain EXAM: CHEST  2 VIEW COMPARISON:  April 21, 2015 FINDINGS: There is no edema or consolidation. The heart size and pulmonary vascularity are within normal limits. No adenopathy. No bone lesions. IMPRESSION: No edema or consolidation. Electronically Signed   By: Bretta Bang III M.D.   On: 02/10/2016 11:46   Ct Angio Chest Pe W/cm &/or Wo Cm  02/10/2016  CLINICAL DATA:  Pt c/o mid chest pain radiating to right side back w/ associated sob onset last pm, less than 1 week post partum, no pertinent history,  EXAM: CT ANGIOGRAPHY CHEST WITH CONTRAST TECHNIQUE: Multidetector CT imaging of the chest was performed using the standard protocol during bolus  administration of intravenous contrast. Multiplanar CT image reconstructions and MIPs were obtained to evaluate the vascular anatomy. CONTRAST:  100cc ISOVU 370 COMPARISON:  None. FINDINGS: Vascular: Left arm IV contrast injection. Innominate vein and SVC are patent. Mild right atrial enlargement. Right ventricle is nondilated. Satisfactory opacification of pulmonary arteries noted, and there is no evidence of pulmonary emboli. Patent bilateral pulmonary veins. Mild left atrial enlargement. Adequate contrast opacification of the thoracic aorta with no evidence of dissection, aneurysm, or stenosis. There is bovine variant brachiocephalic arch anatomy without proximal stenosis. Mediastinum/Lymph Nodes: No masses or pathologically enlarged lymph nodes identified. No pericardial effusion. Lungs/Pleura: Tiny bilateral pleural effusions. Interstitial septal thickening peripherally most marked at the lung bases. Dependent atelectasis posteriorly in both lower lobes. Thickening of or trace fluid in the interlobar fissures. Upper abdomen: No acute findings. Musculoskeletal: No chest wall mass or suspicious bone lesions identified. Review of the MIP images confirms the above findings. IMPRESSION: 1. Negative for acute PE or thoracic aortic dissection. 2. Pulmonary interstitial edema with small effusions suggesting fluid overload. Electronically Signed   By: Corlis Leak  Hassell M.D.   On: 02/10/2016 18:30    Assessment/Plan: Pt with pp preeclampsia and increased BP and pulmonary edema.  Will not place on magnesium due to pulmonary edema, but will begin aggressive diuresis with lasix and treat BP with labetalol IV prn.    Oliver PilaRICHARDSON,Hieu Herms W 02/10/2016, 8:51 PM

## 2016-02-10 NOTE — ED Provider Notes (Signed)
Patient continues to be hypertensive here in CT showed no evidence of a PE but does show bilateral pleural effusions and pulmonary edema which is most likely the cause of her shortness of breath and chest pain. Discussed this with Dr. Senaida Oresichardson patient's OB/GYN who [facility] She was started on Lasix and labetalol.  Gwyneth SproutWhitney Kunaal Walkins, MD 02/10/16 [phone]

## 2016-02-10 NOTE — ED Notes (Signed)
Pt c/o chest tightness and "twisting" sensation onset last night with SOB, worse at night, pain with inspiration, pain radiates around to right back.

## 2016-02-10 NOTE — Progress Notes (Signed)
[facility] with SOB and high BP's. Here for diuresing and observation of BP. On arrival, verbal and oriented. Stable at the moment and vitals checked and recorded. Will keep monitoring.

## 2016-02-10 NOTE — ED Provider Notes (Signed)
CSN: [id number]     Arrival date & time 02/10/16  1110 History   First MD Initiated Contact with Patient 02/10/16 1156     No chief complaint on file.    (Consider location/radiation/quality/duration/timing/severity/associated sxs/prior Treatment) HPI Comments: CP yesterday evening 5PM, while resting Just had baby less than 1 week ago CP and SOB worse at night, and pain with deep breaths in back and chest No hemoptysis Now pain is 10/10  Sharp/tightness No nausea/vomiting/diaphoresis  No hx blood clots No fam hx heart disease No smoking    Past Medical History  Diagnosis Date  . No pertinent past medical history   . Normal vaginal delivery     2007 and 2010  . Gallstones   . Gestational diabetes mellitus (GDM), antepartum   . Gestational diabetes     diet controlled   Past Surgical History  Procedure Laterality Date  . Cholecystectomy    . Wisdom tooth extraction Bilateral 2013    upper  . No past surgeries    . Laparoscopic cholecystectomy single site with intraoperative cholangiogram N/A 12/04/2014    Procedure: LAPAROSCOPIC CHOLECYSTECTOMY SINGLE SITE WITH INTRAOPERATIVE CHOLANGIOGRAM;  Surgeon: Karie SodaSteven Gross, MD;  Location: [facility];  Service: General;  Laterality: N/A;   Family History  Problem Relation Age of Onset  . Other Neg Hx   . Alcohol abuse Neg Hx   . Arthritis Neg Hx   . Asthma Neg Hx   . Birth defects Neg Hx   . Cancer Neg Hx   . COPD Neg Hx   . Depression Neg Hx   . Diabetes Neg Hx   . Drug abuse Neg Hx   . Early death Neg Hx   . Hearing loss Neg Hx   . Heart disease Neg Hx   . Hyperlipidemia Neg Hx   . Hypertension Neg Hx   . Kidney disease Neg Hx   . Learning disabilities Neg Hx   . Mental illness Neg Hx   . Mental retardation Neg Hx   . Miscarriages / Stillbirths Neg Hx   . Stroke Neg Hx   . Vision loss Neg Hx   . Varicose Veins Neg Hx    Social History  Substance Use Topics  . Smoking status: Never Smoker   . Smokeless tobacco:  Never Used  . Alcohol Use: No   OB History    Gravida Para Term Preterm AB TAB SAB Ectopic Multiple Living   4 4 4  0 0 0 0 0 0 4     Review of Systems  Constitutional: Negative for fever.  HENT: Negative for sore throat.   Eyes: Negative for visual disturbance.  Respiratory: Positive for shortness of breath. Negative for cough.   Cardiovascular: Positive for chest pain.  Gastrointestinal: Negative for nausea, vomiting and abdominal pain.  Genitourinary: Negative for difficulty urinating.  Musculoskeletal: Negative for back pain and neck pain.  Skin: Negative for rash.  Neurological: Negative for syncope and headaches.      Allergies  Review of patient's allergies indicates no known allergies.  Home Medications   Prior to Admission medications   Medication Sig Start Date End Date Taking? Authorizing Provider  acetaminophen (TYLENOL) 500 MG tablet Take 1,000 mg by mouth every 6 (six) hours as needed for mild pain.   Yes Historical Provider, MD  ferrous sulfate 325 (65 FE) MG tablet Take 1 tablet (325 mg total) by mouth daily. 04/22/15  Yes Marisa Severinlga Otter, MD  ibuprofen (ADVIL,MOTRIN) 600 MG tablet Take  1 tablet (600 mg total) by mouth every 6 (six) hours. 02/06/16  Yes Lavina Hamman, MD  NIFEdipine (PROCARDIA XL) 30 MG 24 hr tablet Take 1 tablet (30 mg total) by mouth 2 (two) times daily. 02/10/16   Alvira Monday, MD   BP 175/83 mmHg  Pulse 69  Temp(Src) 99.1 F (37.3 C) (Oral)  Resp 18  SpO2 100%  Breastfeeding? Yes Physical Exam  Constitutional: She is oriented to person, place, and time. She appears well-developed and well-nourished. No distress.  HENT:  Head: Normocephalic and atraumatic.  Eyes: Conjunctivae and EOM are normal.  Neck: Normal range of motion.  Cardiovascular: Normal rate, regular rhythm, normal heart sounds and intact distal pulses.  Exam reveals no gallop and no friction rub.   No murmur heard. Pulmonary/Chest: Effort normal and breath sounds normal. No  respiratory distress. She has no wheezes. She has no rales. She exhibits tenderness (breast feeding).  Abdominal: Soft. She exhibits no distension. There is no tenderness. There is no guarding.  Musculoskeletal: She exhibits no edema or tenderness.  Neurological: She is alert and oriented to person, place, and time.  Skin: Skin is warm and dry. No rash noted. She is not diaphoretic. No erythema.  Nursing note and vitals reviewed.   ED Course  Procedures (including critical care time) Labs Review Labs Reviewed  BASIC METABOLIC PANEL - Abnormal; Notable for the following:    Calcium 8.6 (*)    All other components within normal limits  CBC - Abnormal; Notable for the following:    Hemoglobin 11.1 (*)    HCT 33.7 (*)    MCV 74.9 (*)    MCH 24.7 (*)    All other components within normal limits  URINALYSIS, ROUTINE W REFLEX MICROSCOPIC (NOT AT [facility]) - Abnormal; Notable for the following:    Hgb urine dipstick MODERATE (*)    Leukocytes, UA SMALL (*)    All other components within normal limits  URINE MICROSCOPIC-ADD ON - Abnormal; Notable for the following:    Squamous Epithelial / LPF 0-5 (*)    Bacteria, UA RARE (*)    All other components within normal limits  I-STAT TROPOININ, ED  Rosezena Sensor, ED    Imaging Review Dg Chest 2 View  02/10/2016  CLINICAL DATA:  Shortness of breath ; chest pain EXAM: CHEST  2 VIEW COMPARISON:  April 21, 2015 FINDINGS: There is no edema or consolidation. The heart size and pulmonary vascularity are within normal limits. No adenopathy. No bone lesions. IMPRESSION: No edema or consolidation. Electronically Signed   By: Bretta Bang III M.D.   On: 02/10/2016 11:46   I have personally reviewed and evaluated these images and lab results as part of my medical decision-making.   EKG Interpretation   Date/Time:  Monday February 10 2016 11:21:42 EDT Ventricular Rate:  59 PR Interval:  128 QRS Duration: 95 QT Interval:  421 QTC Calculation: 417 R  Axis:   46 Text Interpretation:  Sinus rhythm Nonspecific t wave abnormality No  significant change since last tracing Confirmed by [facility] MD, Bryden Darden  ([id number]) on 02/10/2016 12:01:03 PM      Emergency Ultrasound:  US Guidance for needle guidance  Performed by Dr. Dalene Seltzer Indication: peripheral IV placement  Linear probe used in real-time to visualize location of needle entry through skin. Interpretation: IV in place Image archived electronically.   MDM   Final diagnoses:  Chest pain, unspecified chest pain type  Shortness of breath  Hypertension, new onset, after  pregnancy   35 year old female, one week postpartum after a spontaneous vaginal delivery, presents with concern for chest pain and shortness of breath.  EKG without significant changes.  Troponin negative x2 and pt low risk HEART score.  Patient Wells score 4.5, and given pleuritic CP/SOB/recent pregnancy/hospitalization ordered CT PE study.  No sign of pulmonary edema, and doubt acute cardiomyopathy.  Patient's blood pressures are elevated between 150s and 180 systolic, and 80s diastolic.  She has no proteinuria.  Discussed with OB/GYN on-call, Dr. Senaida Ores, who recommends starting Procardia, and patient having a an outpatient follow-up in 2 days for recheck of blood pressures.   CT PE study pending at time of transfer of care to Dr. Anitra Lauth.  Alvira Monday, MD 02/10/16 [phone]

## 2016-02-11 LAB — COMPREHENSIVE METABOLIC PANEL
ALBUMIN: 3 g/dL — AB (ref 3.5–5.0)
ALK PHOS: 102 U/L (ref 38–126)
ALT: 150 U/L — ABNORMAL HIGH (ref 14–54)
ANION GAP: 10 (ref 5–15)
AST: 113 U/L — AB (ref 15–41)
BUN: 14 mg/dL (ref 6–20)
CO2: 25 mmol/L (ref 22–32)
Calcium: 8 mg/dL — ABNORMAL LOW (ref 8.9–10.3)
Chloride: 106 mmol/L (ref 101–111)
Creatinine, Ser: 0.62 mg/dL (ref 0.44–1.00)
GFR calc Af Amer: >60 mL/min (ref >60)
GFR calc non Af Amer: >60 mL/min (ref >60)
GLUCOSE: 74 mg/dL (ref 65–99)
POTASSIUM: 3.5 mmol/L (ref 3.5–5.1)
SODIUM: 141 mmol/L (ref 135–145)
Total Bilirubin: 0.4 mg/dL (ref 0.3–1.2)
Total Protein: 6.5 g/dL (ref 6.5–8.1)

## 2016-02-11 LAB — CBC
HEMATOCRIT: 36.5 % (ref 36.0–46.0)
HEMOGLOBIN: 12 g/dL (ref 12.0–15.0)
MCH: 24.2 pg — AB (ref 26.0–34.0)
MCHC: 32.9 g/dL (ref 30.0–36.0)
MCV: 73.6 fL — ABNORMAL LOW (ref 78.0–100.0)
Platelets: 274 10*3/uL (ref 150–400)
RBC: 4.96 MIL/uL (ref 3.87–5.11)
RDW: 14.6 % (ref 11.5–15.5)
WBC: 7.7 10*3/uL (ref 4.0–10.5)

## 2016-02-11 MED ORDER — ONDANSETRON HCL 4 MG/2ML IJ SOLN: 4.0000 mg | Freq: Four times a day (QID) | INTRAMUSCULAR | Status: DC | PRN

## 2016-02-11 MED ORDER — FUROSEMIDE 20 MG PO TABS
20.0000 mg | ORAL_TABLET | Freq: Every day | ORAL | Status: DC
  Administered 2016-02-11: 20 mg via ORAL
  Filled 2016-02-11 (×2): qty 1

## 2016-02-11 MED ORDER — ACETAMINOPHEN 325 MG PO TABS
650.0000 mg | ORAL_TABLET | Freq: Four times a day (QID) | ORAL | Status: DC | PRN
  Administered 2016-02-11: 650 mg via ORAL
  Filled 2016-02-11: qty 2

## 2016-02-11 MED ORDER — OXYCODONE HCL 5 MG PO TABS
5.0000 mg | ORAL_TABLET | ORAL | Status: DC | PRN
  Administered 2016-02-11: 5 mg via ORAL
  Filled 2016-02-11: qty 1

## 2016-02-11 NOTE — Progress Notes (Signed)
UR chart review completed.  

## 2016-02-11 NOTE — Progress Notes (Signed)
HD #2 probable postpartum preeclampsia Feeling better, no SOB or CP, peed a lot, occasional headache Afeb, VSS, BP currently normal I/O neg 1 liter plus CBC stable, CMP with nl K+, nl Cre, elevated LFTs She is stable currently, will monitor today to see how BP and UOP does.  Will give a dose of PO lasix this am, change pain meds to Tylenol and Oxycodone

## 2016-02-11 NOTE — Progress Notes (Signed)
Feeling better, ready to go home Afeb, VSS, BP is normal She reports good UOP D/c home

## 2016-02-11 NOTE — Progress Notes (Signed)
Patient /husband given discharge information. Reviewed f/u appts and prescription information. Patient reports feeling much better, no headache. Pt will call md if SOB returns.

## 2016-02-11 NOTE — Discharge Instructions (Signed)
Call for SOB

## 2016-02-12 NOTE — Discharge Summary (Signed)
Physician Discharge Summary  Patient ID: Suzanne Ramirez MRN: [id number] DOB/AGE: Sep 14, 1981 35 y.o.  Admit date: 02/10/2016 Discharge date: 02/12/2016  Admission Diagnoses:  Postpartum pulmonary edema, preeclampsia vs mild CHF  Discharge Diagnoses: Same Active Problems:   Pulmonary edema   Preeclampsia in postpartum period   Discharged Condition: good  Hospital Course: Pt admitted with SOB and pulmonary edema, had elevated BP initially which came down with IV Labetalol in [facility].  She was [facility], received Lasix for diuresis, did not require any other medication for BP.  Labs normal except LFTs low 100s.  After about 24 hours of diuresis, she was feeling much better, BP normal, felt to be stable enough for d/c home.  Discharge Exam: Blood pressure 119/70, pulse 77, temperature 98.7 F (37.1 C), temperature source Oral, resp. rate 18, weight 74.957 kg (165 lb 4 oz), SpO2 100 %, currently breastfeeding. General appearance: alert  Disposition: 01-Home or Self Care  Discharge Instructions    Diet - low sodium heart healthy    Complete by:  As directed             Medication List    TAKE these medications        acetaminophen 500 MG tablet  Commonly known as:  TYLENOL  Take 1,000 mg by mouth every 6 (six) hours as needed for mild pain.     ferrous sulfate 325 (65 FE) MG tablet  Take 1 tablet (325 mg total) by mouth daily.     ibuprofen 600 MG tablet  Commonly known as:  ADVIL,MOTRIN  Take 1 tablet (600 mg total) by mouth every 6 (six) hours.     NIFEdipine 30 MG 24 hr tablet  Commonly known as:  PROCARDIA XL  Take 1 tablet (30 mg total) by mouth 2 (two) times daily.           Follow-up Information    Follow up with Suzanne Ramirez D, MD In 2 days.   Specialty:  Obstetrics and Gynecology   Why:  check BP and labs   Contact information:   [address], [address] [place] [address] [phone]       Signed: Zenaida NieceMEISINGER,Samnang Shugars  D 02/12/2016, 8:49 AM

## 2016-08-26 ENCOUNTER — Other Ambulatory Visit: Payer: Self-pay | Admitting: Internal Medicine

## 2016-08-26 ENCOUNTER — Ambulatory Visit
Admission: RE | Admit: 2016-08-26 | Discharge: 2016-08-26 | Disposition: A | Discharge status: Home / Self Care | Payer: Medicaid Other | Source: Ambulatory Visit | Attending: Internal Medicine | Admitting: Internal Medicine

## 2016-08-26 DIAGNOSIS — Z8632 Personal history of gestational diabetes: Secondary | ICD-10-CM | POA: Diagnosis not present

## 2016-08-26 DIAGNOSIS — R079 Chest pain, unspecified: Secondary | ICD-10-CM | POA: Diagnosis not present

## 2016-11-06 DIAGNOSIS — R55 Syncope and collapse: Secondary | ICD-10-CM | POA: Diagnosis not present

## 2016-11-06 DIAGNOSIS — J0100 Acute maxillary sinusitis, unspecified: Secondary | ICD-10-CM | POA: Diagnosis not present

## 2016-11-21 IMAGING — US US MFM FETAL BPP W/O NON-STRESS
1 series · 12 of 12 positions shown · non-contrast
Comparison: none

[Series 1: us mfm fetal bpp w/o non-stress · 12 acquisitions, 12 frames shown]
[im 1/12]
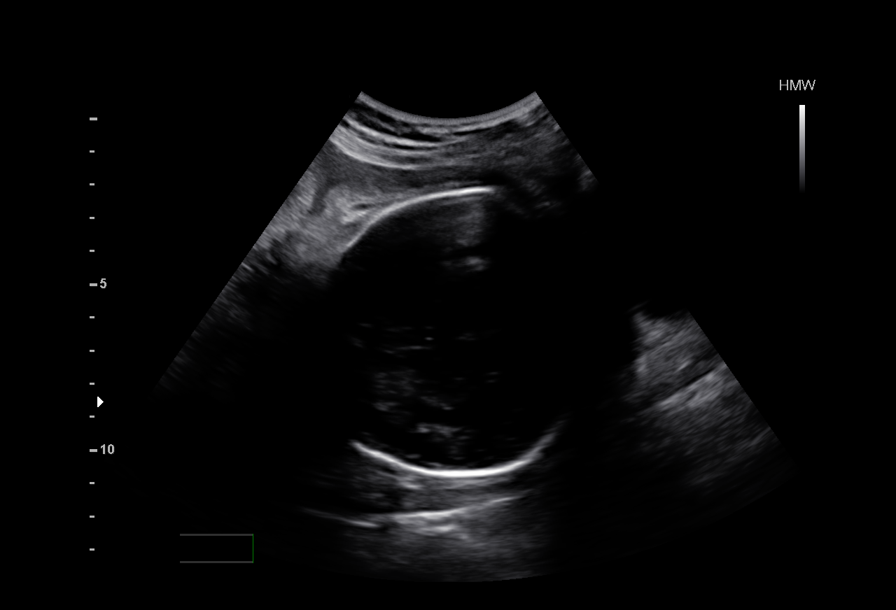
[im 2/12]
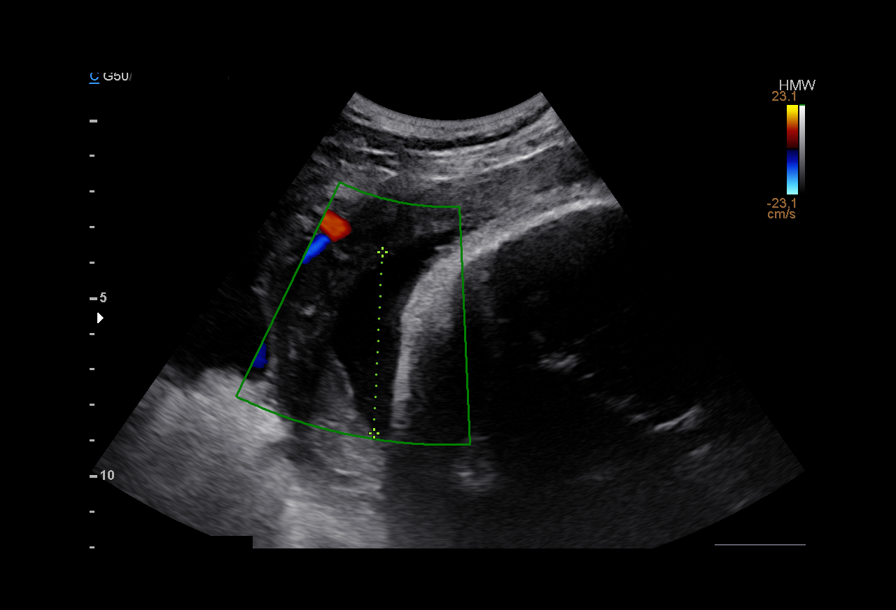
[im 3/12]
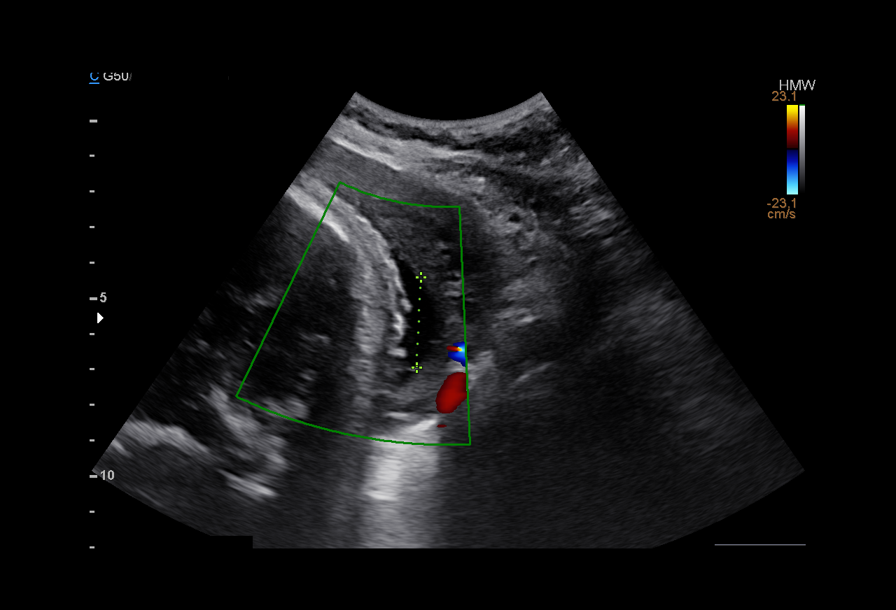
[im 4/12]
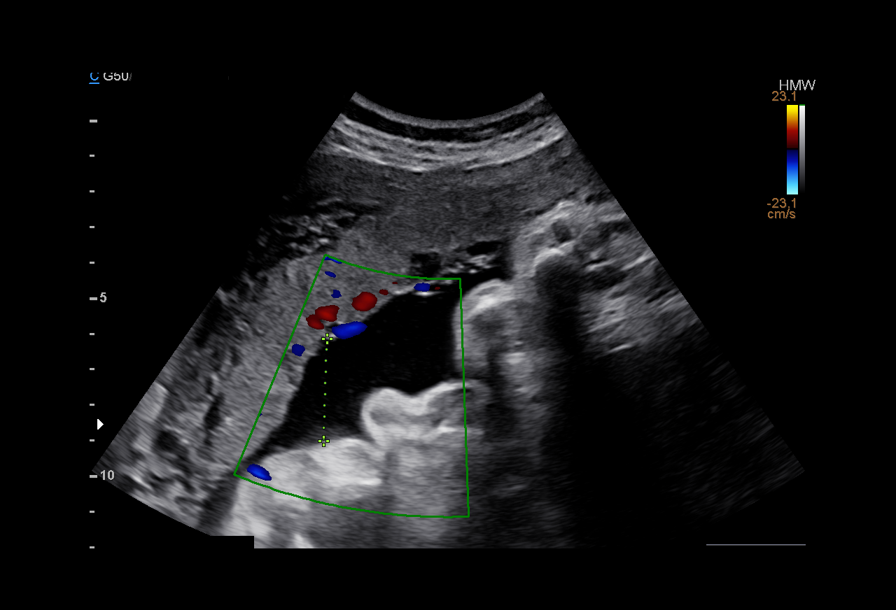
[im 5/12]
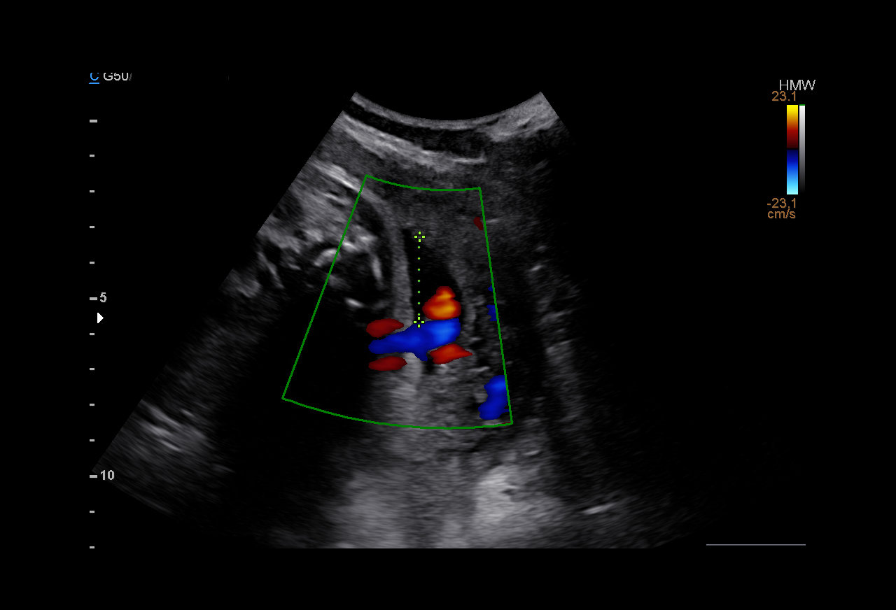
[im 6/12]
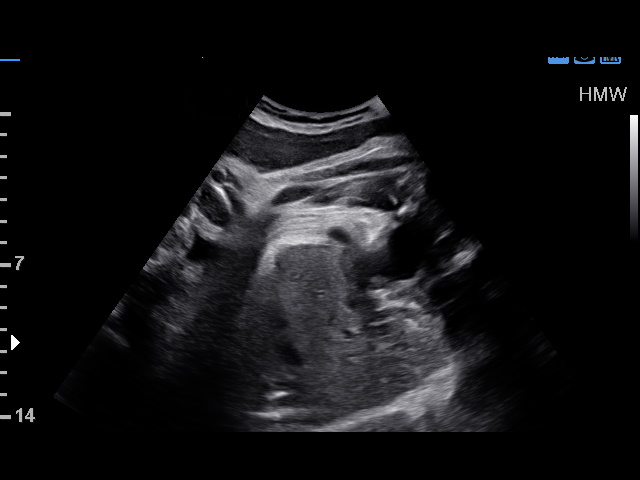
[im 7/12]
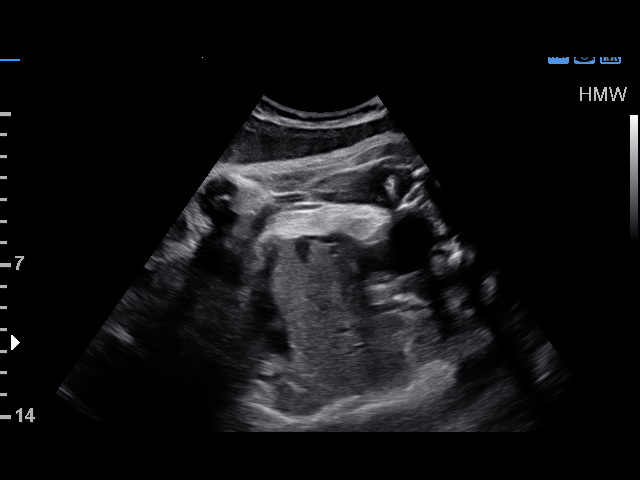
[im 8/12]
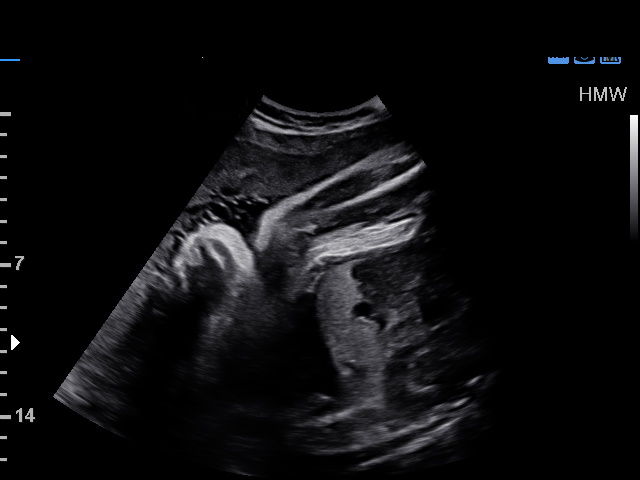
[im 9/12]
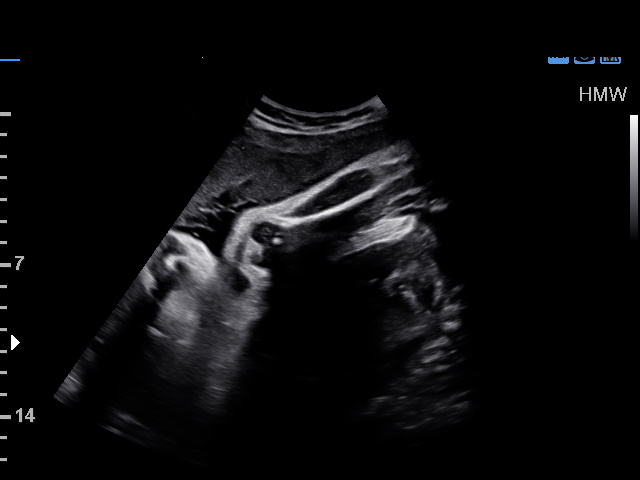
[im 10/12]
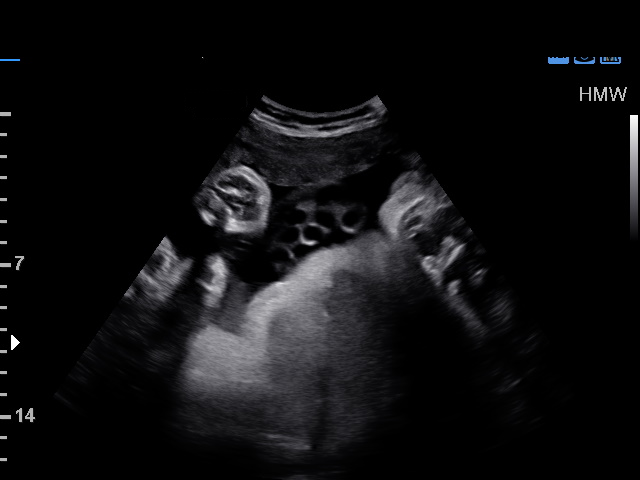
[im 11/12]
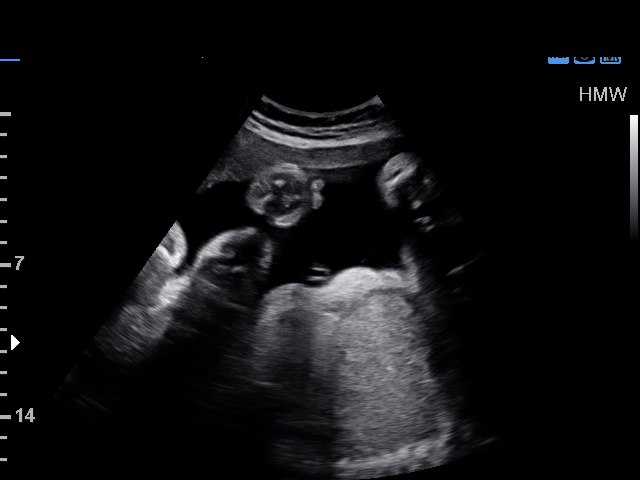
[im 12/12]
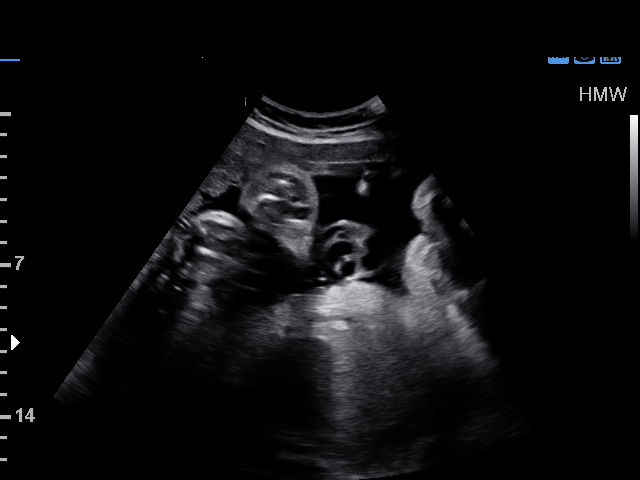

[12 of 12 positions shown; findings below may reference images not displayed]

Performed By:     Dieter Yeh          Secondary Phy.:   ERASMO RHODES
#[id number]
MAU/Triage

1  NOMASIBULELE MOATSHE           [id number]      [id number]     [id number]
Indications

36 weeks gestation of pregnancy
Non-reactive NST
Gestational diabetes in pregnancy, diet
controlled
OB History

Gravidity:    4         Term:   3
Living:       3
Fetal Evaluation

Num Of Fetuses:     1
Fetal Heart         127
Rate(bpm):
Cardiac Activity:   Observed
Presentation:       Cephalic

Amniotic Fluid
AFI FV:      Subjectively within normal limits
AFI Sum:     12.9    cm      44   %Tile     Larg Pckt:   5.09   cm
RUQ:   5.09   cm    RLQ:    2.4    cm    LUQ:   2.54    cm   LLQ:    2.87   cm
Biophysical Evaluation

Amniotic F.V:   Within normal limits       F. Tone:        Observed
F. Movement:    Observed                   Score:          [DATE]
F. Breathing:   Observed
Gestational Age

LMP:           39w 6d       Date:   04/09/15                 EDD:   01/14/16
Clinical EDD:  36w 1d                                        EDD:   02/09/16
Best:          36w 1d    Det. By:   Clinical EDD             EDD:   02/09/16
Cervix Uterus Adnexa

Cervix
Not visualized (advanced GA >09wks)
Impression

SIUP at 36+1 weeks
Cephalic presentation
Normal amniotic fluid volume
BPP [DATE]
Recommendations

Follow-up as clinically indicated

## 2017-01-22 DIAGNOSIS — R531 Weakness: Secondary | ICD-10-CM | POA: Diagnosis not present

## 2017-01-22 DIAGNOSIS — R05 Cough: Secondary | ICD-10-CM | POA: Diagnosis not present

## 2017-01-27 DIAGNOSIS — R739 Hyperglycemia, unspecified: Secondary | ICD-10-CM | POA: Diagnosis not present

## 2017-01-27 DIAGNOSIS — R531 Weakness: Secondary | ICD-10-CM | POA: Diagnosis not present

## 2017-01-27 DIAGNOSIS — R05 Cough: Secondary | ICD-10-CM | POA: Diagnosis not present

## 2017-02-01 ENCOUNTER — Other Ambulatory Visit (HOSPITAL_COMMUNITY): Payer: Self-pay | Admitting: Internal Medicine

## 2017-02-01 DIAGNOSIS — E011 Iodine-deficiency related multinodular (endemic) goiter: Secondary | ICD-10-CM

## 2017-02-01 DIAGNOSIS — E0590 Thyrotoxicosis, unspecified without thyrotoxic crisis or storm: Secondary | ICD-10-CM

## 2017-02-18 ENCOUNTER — Encounter (HOSPITAL_COMMUNITY): Payer: Medicaid Other

## 2017-02-19 ENCOUNTER — Encounter (HOSPITAL_COMMUNITY): Payer: Medicaid Other

## 2017-02-22 DIAGNOSIS — Z021 Encounter for pre-employment examination: Secondary | ICD-10-CM | POA: Diagnosis not present

## 2017-02-22 DIAGNOSIS — Z0001 Encounter for general adult medical examination with abnormal findings: Secondary | ICD-10-CM | POA: Diagnosis not present

## 2017-03-11 ENCOUNTER — Encounter (HOSPITAL_COMMUNITY)
Admission: RE | Admit: 2017-03-11 | Discharge: 2017-03-11 | Disposition: A | Discharge status: Home / Self Care | Payer: 59 | Source: Ambulatory Visit | Attending: Internal Medicine | Admitting: Internal Medicine

## 2017-03-11 DIAGNOSIS — E0590 Thyrotoxicosis, unspecified without thyrotoxic crisis or storm: Secondary | ICD-10-CM | POA: Insufficient documentation

## 2017-03-11 DIAGNOSIS — E011 Iodine-deficiency related multinodular (endemic) goiter: Secondary | ICD-10-CM | POA: Insufficient documentation

## 2017-03-11 MED ORDER — SODIUM IODIDE I 131 CAPSULE
5.9000 | Freq: Once | INTRAVENOUS | Status: AC
  Administered 2017-03-11: 5.9 via ORAL

## 2017-03-12 ENCOUNTER — Encounter (HOSPITAL_COMMUNITY)
Admission: RE | Admit: 2017-03-12 | Discharge: 2017-03-12 | Disposition: A | Discharge status: Home / Self Care | Payer: 59 | Source: Ambulatory Visit | Attending: Internal Medicine | Admitting: Internal Medicine

## 2017-03-12 DIAGNOSIS — E0590 Thyrotoxicosis, unspecified without thyrotoxic crisis or storm: Secondary | ICD-10-CM | POA: Diagnosis not present

## 2017-03-12 DIAGNOSIS — E011 Iodine-deficiency related multinodular (endemic) goiter: Secondary | ICD-10-CM | POA: Diagnosis not present

## 2017-03-12 MED ORDER — SODIUM PERTECHNETATE TC 99M INJECTION
10.0000 | Freq: Once | INTRAVENOUS | Status: AC | PRN
  Administered 2017-03-12: 10 via INTRAVENOUS

## 2017-04-09 DIAGNOSIS — R102 Pelvic and perineal pain: Secondary | ICD-10-CM | POA: Diagnosis not present

## 2017-04-09 DIAGNOSIS — R300 Dysuria: Secondary | ICD-10-CM | POA: Diagnosis not present

## 2017-04-09 DIAGNOSIS — Z30431 Encounter for routine checking of intrauterine contraceptive device: Secondary | ICD-10-CM | POA: Diagnosis not present

## 2017-06-09 ENCOUNTER — Encounter (HOSPITAL_COMMUNITY): Payer: Self-pay | Admitting: *Deleted

## 2017-06-09 ENCOUNTER — Inpatient Hospital Stay (HOSPITAL_COMMUNITY)
Admission: AD | Admit: 2017-06-09 | Discharge: 2017-06-10 | Disposition: A | Discharge status: Home / Self Care | Payer: 59 | Source: Ambulatory Visit | Attending: Obstetrics and Gynecology | Admitting: Obstetrics and Gynecology

## 2017-06-09 DIAGNOSIS — R110 Nausea: Secondary | ICD-10-CM | POA: Diagnosis not present

## 2017-06-09 DIAGNOSIS — Z9049 Acquired absence of other specified parts of digestive tract: Secondary | ICD-10-CM | POA: Insufficient documentation

## 2017-06-09 DIAGNOSIS — Z9889 Other specified postprocedural states: Secondary | ICD-10-CM | POA: Insufficient documentation

## 2017-06-09 DIAGNOSIS — Z79899 Other long term (current) drug therapy: Secondary | ICD-10-CM | POA: Insufficient documentation

## 2017-06-09 DIAGNOSIS — R42 Dizziness and giddiness: Secondary | ICD-10-CM | POA: Insufficient documentation

## 2017-06-09 LAB — CBC
HEMATOCRIT: 35.6 % — AB (ref 36.0–46.0)
Hemoglobin: 11.6 g/dL — ABNORMAL LOW (ref 12.0–15.0)
MCH: 24.8 pg — AB (ref 26.0–34.0)
MCHC: 32.6 g/dL (ref 30.0–36.0)
MCV: 76.2 fL — AB (ref 78.0–100.0)
PLATELETS: 308 10*3/uL (ref 150–400)
RBC: 4.67 MIL/uL (ref 3.87–5.11)
RDW: 13.9 % (ref 11.5–15.5)
WBC: 6.8 10*3/uL (ref 4.0–10.5)

## 2017-06-09 LAB — URINALYSIS, DIPSTICK ONLY
BILIRUBIN URINE: NEGATIVE
Glucose, UA: NEGATIVE mg/dL
Hgb urine dipstick: NEGATIVE
KETONES UR: NEGATIVE mg/dL
Leukocytes, UA: NEGATIVE
NITRITE: NEGATIVE
PROTEIN: NEGATIVE mg/dL
SPECIFIC GRAVITY, URINE: 1.016 (ref 1.005–1.030)
pH: 5 (ref 5.0–8.0)

## 2017-06-09 LAB — COMPREHENSIVE METABOLIC PANEL
ALT: 15 U/L (ref 14–54)
AST: 21 U/L (ref 15–41)
Albumin: 4 g/dL (ref 3.5–5.0)
Alkaline Phosphatase: 84 U/L (ref 38–126)
Anion gap: 7 (ref 5–15)
BILIRUBIN TOTAL: 0.6 mg/dL (ref 0.3–1.2)
BUN: 12 mg/dL (ref 6–20)
CALCIUM: 9.5 mg/dL (ref 8.9–10.3)
CHLORIDE: 104 mmol/L (ref 101–111)
CO2: 26 mmol/L (ref 22–32)
CREATININE: 0.7 mg/dL (ref 0.44–1.00)
Glucose, Bld: 117 mg/dL — ABNORMAL HIGH (ref 65–99)
Potassium: 3.9 mmol/L (ref 3.5–5.1)
Sodium: 137 mmol/L (ref 135–145)
TOTAL PROTEIN: 7.6 g/dL (ref 6.5–8.1)

## 2017-06-09 LAB — POCT PREGNANCY, URINE: PREG TEST UR: NEGATIVE

## 2017-06-09 MED ORDER — MECLIZINE HCL 25 MG PO TABS
25.0000 mg | ORAL_TABLET | Freq: Once | ORAL | Status: AC
  Administered 2017-06-09: 25 mg via ORAL
  Filled 2017-06-09: qty 1

## 2017-06-09 MED ORDER — LACTATED RINGERS IV BOLUS (SEPSIS)
1000.0000 mL | Freq: Once | INTRAVENOUS | Status: AC
  Administered 2017-06-09: 1000 mL via INTRAVENOUS

## 2017-06-09 NOTE — MAU Note (Signed)
Pt was working and felt like she was very dizzy, hot and her legs got weak.  She says she had a light headache as well. She was working in the OR.  She said she has been drinking water and said she ate dinner at 1715.

## 2017-06-09 NOTE — Discharge Instructions (Signed)

## 2017-06-09 NOTE — MAU Provider Note (Signed)
History     CSN: [id number]  Arrival date and time: 06/09/17 2158   First Provider Initiated Contact with Patient 06/09/17 2208      Chief Complaint  Patient presents with  . Dizziness  . Nausea   Suzanne Ramirez is a 36 y.o. Who presents today for dizziness. She states that she last ate around 1700. She has been working back to back OR cases since 1700, and started to feel weak, dizzy and nauseous just prior to arrival. She denies that she is pregnant at this time. She is on birth control.    Dizziness  This is a new problem. The current episode started today. The problem has been unchanged. Associated symptoms include nausea. Pertinent negatives include no chills, fever or vomiting. The symptoms are aggravated by standing. She has tried nothing for the symptoms.    Past Medical History:  Diagnosis Date  . Gallstones   . Gestational diabetes    diet controlled  . Gestational diabetes mellitus (GDM), antepartum   . No pertinent past medical history   . Normal vaginal delivery    2007 and 2010    Past Surgical History:  Procedure Laterality Date  . CHOLECYSTECTOMY    . LAPAROSCOPIC CHOLECYSTECTOMY SINGLE SITE WITH INTRAOPERATIVE CHOLANGIOGRAM N/A 12/04/2014   Procedure: LAPAROSCOPIC CHOLECYSTECTOMY SINGLE SITE WITH INTRAOPERATIVE CHOLANGIOGRAM;  Surgeon: Karie Soda, MD;  Location: [facility];  Service: General;  Laterality: N/A;  . NO PAST SURGERIES    . WISDOM TOOTH EXTRACTION Bilateral 2013   upper    Family History  Problem Relation Age of Onset  . Other Neg Hx   . Alcohol abuse Neg Hx   . Arthritis Neg Hx   . Asthma Neg Hx   . Birth defects Neg Hx   . Cancer Neg Hx   . COPD Neg Hx   . Depression Neg Hx   . Diabetes Neg Hx   . Drug abuse Neg Hx   . Early death Neg Hx   . Hearing loss Neg Hx   . Heart disease Neg Hx   . Hyperlipidemia Neg Hx   . Hypertension Neg Hx   . Kidney disease Neg Hx   . Learning disabilities Neg Hx   . Mental illness Neg Hx   .  Mental retardation Neg Hx   . Miscarriages / Stillbirths Neg Hx   . Stroke Neg Hx   . Vision loss Neg Hx   . Varicose Veins Neg Hx     Social History  Substance Use Topics  . Smoking status: Never Smoker  . Smokeless tobacco: Never Used  . Alcohol use No    Allergies: No Known Allergies  Prescriptions Prior to Admission  Medication Sig Dispense Refill Last Dose  . acetaminophen (TYLENOL) 500 MG tablet Take 1,000 mg by mouth every 6 (six) hours as needed for mild pain.   2 weeks  . ferrous sulfate 325 (65 FE) MG tablet Take 1 tablet (325 mg total) by mouth daily. 30 tablet 0 Past Week at Unknown time  . ibuprofen (ADVIL,MOTRIN) 600 MG tablet Take 1 tablet (600 mg total) by mouth every 6 (six) hours. 30 tablet 0 02/09/2016 at Unknown time  . NIFEdipine (PROCARDIA XL) 30 MG 24 hr tablet Take 1 tablet (30 mg total) by mouth 2 (two) times daily. 40 tablet 0     Review of Systems  Constitutional: Negative for chills and fever.  Gastrointestinal: Positive for nausea. Negative for vomiting.  Genitourinary: Negative for vaginal  bleeding.  Neurological: Positive for dizziness. Negative for speech difficulty.   Physical Exam   Blood pressure 115/66, pulse 74, temperature (!) 97.5 F (36.4 C), temperature source Oral, resp. rate 16, currently breastfeeding.  Physical Exam  Nursing note and vitals reviewed. Constitutional: She is oriented to person, place, and time. She appears well-developed and well-nourished. No distress.  HENT:  Head: Normocephalic.  Cardiovascular: Normal rate.   Respiratory: Effort normal.  GI: Soft. There is no tenderness. There is no rebound.  Neurological: She is alert and oriented to person, place, and time.  Skin: Skin is warm and dry.  Psychiatric: She has a normal mood and affect.   Results for orders placed or performed during the hospital encounter of 06/09/17 (from the past 24 hour(s))  CBC     Status: Abnormal   Collection Time: 06/09/17 10:33 PM   Result Value Ref Range   WBC 6.8 4.0 - 10.5 K/uL   RBC 4.67 3.87 - 5.11 MIL/uL   Hemoglobin 11.6 (L) 12.0 - 15.0 g/dL   HCT 09.835.6 (L) 11.936.0 - 14.746.0 %   MCV 76.2 (L) 78.0 - 100.0 fL   MCH 24.8 (L) 26.0 - 34.0 pg   MCHC 32.6 30.0 - 36.0 g/dL   RDW 82.913.9 56.211.5 - 13.015.5 %   Platelets 308 150 - 400 K/uL  Comprehensive metabolic panel     Status: Abnormal   Collection Time: 06/09/17 10:33 PM  Result Value Ref Range   Sodium 137 135 - 145 mmol/L   Potassium 3.9 3.5 - 5.1 mmol/L   Chloride 104 101 - 111 mmol/L   CO2 26 22 - 32 mmol/L   Glucose, Bld 117 (H) 65 - 99 mg/dL   BUN 12 6 - 20 mg/dL   Creatinine, Ser 8.650.70 0.44 - 1.00 mg/dL   Calcium 9.5 8.9 - 78.410.3 mg/dL   Total Protein 7.6 6.5 - 8.1 g/dL   Albumin 4.0 3.5 - 5.0 g/dL   AST 21 15 - 41 U/L   ALT 15 14 - 54 U/L   Alkaline Phosphatase 84 38 - 126 U/L   Total Bilirubin 0.6 0.3 - 1.2 mg/dL   GFR calc non Af Amer >60 >60 mL/min   GFR calc Af Amer >60 >60 mL/min   Anion gap 7 5 - 15  Urinalysis, dipstick only     Status: None   Collection Time: 06/09/17 11:35 PM  Result Value Ref Range   Color, Urine YELLOW YELLOW   APPearance CLEAR CLEAR   Specific Gravity, Urine 1.016 1.005 - 1.030   pH 5.0 5.0 - 8.0   Glucose, UA NEGATIVE NEGATIVE mg/dL   Hgb urine dipstick NEGATIVE NEGATIVE   Bilirubin Urine NEGATIVE NEGATIVE   Ketones, ur NEGATIVE NEGATIVE mg/dL   Protein, ur NEGATIVE NEGATIVE mg/dL   Nitrite NEGATIVE NEGATIVE   Leukocytes, UA NEGATIVE NEGATIVE  Pregnancy, urine POC     Status: None   Collection Time: 06/09/17 11:53 PM  Result Value Ref Range   Preg Test, Ur NEGATIVE NEGATIVE    MAU Course  Procedures  MDM EKG: NSR IV bolus with LR Meclizine Ibuprofen given for headache.  Patient reports that sx have resolved.   Assessment and Plan   1. Dizziness    DC home Comfort measures reviewed  RX: none  Return to MAU as needed FU with OB as planned  Follow-up Information    Bovard-Stuckert, Jody, MD Follow up.    Specialty:  Obstetrics and Gynecology Contact information: [address] [place] [address] [phone]            Thressa ShellerHeather Hogan 06/10/2017, 12:04 AM

## 2017-06-09 NOTE — Progress Notes (Signed)
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 Denies bleeding and denies pain.

## 2017-06-10 DIAGNOSIS — R110 Nausea: Secondary | ICD-10-CM | POA: Diagnosis not present

## 2017-06-10 DIAGNOSIS — Z9049 Acquired absence of other specified parts of digestive tract: Secondary | ICD-10-CM | POA: Diagnosis not present

## 2017-06-10 DIAGNOSIS — Z9889 Other specified postprocedural states: Secondary | ICD-10-CM | POA: Diagnosis not present

## 2017-06-10 DIAGNOSIS — R42 Dizziness and giddiness: Secondary | ICD-10-CM | POA: Diagnosis not present

## 2017-06-10 DIAGNOSIS — Z79899 Other long term (current) drug therapy: Secondary | ICD-10-CM | POA: Diagnosis not present

## 2017-06-10 MED ORDER — IBUPROFEN 800 MG PO TABS
800.0000 mg | ORAL_TABLET | Freq: Once | ORAL | Status: AC
  Administered 2017-06-10: 800 mg via ORAL
  Filled 2017-06-10: qty 1

## 2017-06-17 ENCOUNTER — Encounter (HOSPITAL_COMMUNITY): Payer: Self-pay

## 2017-09-23 DIAGNOSIS — Z30432 Encounter for removal of intrauterine contraceptive device: Secondary | ICD-10-CM | POA: Diagnosis not present

## 2017-09-23 DIAGNOSIS — R102 Pelvic and perineal pain: Secondary | ICD-10-CM | POA: Diagnosis not present

## 2017-09-27 DIAGNOSIS — R509 Fever, unspecified: Secondary | ICD-10-CM | POA: Diagnosis not present

## 2017-09-27 DIAGNOSIS — E049 Nontoxic goiter, unspecified: Secondary | ICD-10-CM | POA: Diagnosis not present

## 2017-11-11 DIAGNOSIS — J0100 Acute maxillary sinusitis, unspecified: Secondary | ICD-10-CM | POA: Diagnosis not present

## 2017-11-11 DIAGNOSIS — E049 Nontoxic goiter, unspecified: Secondary | ICD-10-CM | POA: Diagnosis not present

## 2017-11-11 DIAGNOSIS — R05 Cough: Secondary | ICD-10-CM | POA: Diagnosis not present

## 2017-12-09 DIAGNOSIS — G4709 Other insomnia: Secondary | ICD-10-CM | POA: Diagnosis not present

## 2017-12-09 DIAGNOSIS — M79662 Pain in left lower leg: Secondary | ICD-10-CM | POA: Diagnosis not present

## 2018-03-09 DIAGNOSIS — Z0000 Encounter for general adult medical examination without abnormal findings: Secondary | ICD-10-CM | POA: Diagnosis not present

## 2018-06-15 DIAGNOSIS — Z1389 Encounter for screening for other disorder: Secondary | ICD-10-CM | POA: Diagnosis not present

## 2018-06-15 DIAGNOSIS — N393 Stress incontinence (female) (male): Secondary | ICD-10-CM | POA: Diagnosis not present

## 2018-06-15 DIAGNOSIS — Z130 Encounter for screening for diseases of the blood and blood-forming organs and certain disorders involving the immune mechanism: Secondary | ICD-10-CM | POA: Diagnosis not present

## 2018-06-15 DIAGNOSIS — Z01419 Encounter for gynecological examination (general) (routine) without abnormal findings: Secondary | ICD-10-CM | POA: Diagnosis not present

## 2018-06-15 DIAGNOSIS — N8111 Cystocele, midline: Secondary | ICD-10-CM | POA: Diagnosis not present

## 2018-06-15 DIAGNOSIS — Z6829 Body mass index (BMI) 29.0-29.9, adult: Secondary | ICD-10-CM | POA: Diagnosis not present

## 2018-06-15 DIAGNOSIS — Z3009 Encounter for other general counseling and advice on contraception: Secondary | ICD-10-CM | POA: Diagnosis not present

## 2018-10-06 ENCOUNTER — Ambulatory Visit
Admission: RE | Admit: 2018-10-06 | Discharge: 2018-10-06 | Disposition: A | Discharge status: Home / Self Care | Payer: Medicaid Other | Source: Ambulatory Visit | Attending: Internal Medicine | Admitting: Internal Medicine

## 2018-10-06 ENCOUNTER — Other Ambulatory Visit: Payer: Self-pay | Admitting: Internal Medicine

## 2018-10-06 DIAGNOSIS — R52 Pain, unspecified: Secondary | ICD-10-CM

## 2019-11-27 DIAGNOSIS — Z20828 Contact with and (suspected) exposure to other viral communicable diseases: Secondary | ICD-10-CM | POA: Diagnosis not present

## 2020-02-02 ENCOUNTER — Ambulatory Visit: Payer: Medicaid Other | Attending: Internal Medicine

## 2022-02-18 DIAGNOSIS — D649 Anemia, unspecified: Secondary | ICD-10-CM | POA: Diagnosis not present

## 2022-02-18 DIAGNOSIS — R7301 Impaired fasting glucose: Secondary | ICD-10-CM | POA: Diagnosis not present

## 2022-02-18 DIAGNOSIS — E0590 Thyrotoxicosis, unspecified without thyrotoxic crisis or storm: Secondary | ICD-10-CM | POA: Diagnosis not present

## 2022-02-18 DIAGNOSIS — Z0000 Encounter for general adult medical examination without abnormal findings: Secondary | ICD-10-CM | POA: Diagnosis not present

## 2022-11-12 ENCOUNTER — Ambulatory Visit: Payer: Self-pay

## 2022-11-12 NOTE — Telephone Encounter (Signed)
Summary: Cold & Shivering Advice   Pt is calling to report that she is cold and shivering. Negative for COVID. Attempted to call PCP Dr. Ludwig Clarks no answer at the office. Please advise      3rd attempt to contact patient on [phone] to review sx of shivering and chills. No answer. LVMTCB #[phone].

## 2022-11-12 NOTE — Telephone Encounter (Signed)
2nd attempt, pt called, LVMTCB [phone]

## 2022-11-12 NOTE — Telephone Encounter (Signed)
Patient called, left VM to return the call to the office to discuss symptoms with a nurse.   Summary: Cold & Shivering Advice   Pt is calling to report that she is cold and shivering. Negative for COVID. Attempted to call PCP Dr. Ludwig Clarks no answer at the office. Please advise

## 2022-11-13 ENCOUNTER — Emergency Department (HOSPITAL_BASED_OUTPATIENT_CLINIC_OR_DEPARTMENT_OTHER)
Admission: EM | Admit: 2022-11-13 | Discharge: 2022-11-13 | Disposition: A | Discharge status: Home / Self Care | Payer: 59 | Attending: Emergency Medicine | Admitting: Emergency Medicine

## 2022-11-13 ENCOUNTER — Emergency Department (HOSPITAL_BASED_OUTPATIENT_CLINIC_OR_DEPARTMENT_OTHER): Payer: 59

## 2022-11-13 ENCOUNTER — Ambulatory Visit: Payer: Self-pay

## 2022-11-13 ENCOUNTER — Other Ambulatory Visit: Payer: Self-pay

## 2022-11-13 ENCOUNTER — Encounter (HOSPITAL_BASED_OUTPATIENT_CLINIC_OR_DEPARTMENT_OTHER): Payer: Self-pay | Admitting: Pediatrics

## 2022-11-13 DIAGNOSIS — R059 Cough, unspecified: Secondary | ICD-10-CM | POA: Diagnosis not present

## 2022-11-13 DIAGNOSIS — J101 Influenza due to other identified influenza virus with other respiratory manifestations: Secondary | ICD-10-CM | POA: Diagnosis not present

## 2022-11-13 DIAGNOSIS — R531 Weakness: Secondary | ICD-10-CM | POA: Diagnosis not present

## 2022-11-13 DIAGNOSIS — Z20822 Contact with and (suspected) exposure to covid-19: Secondary | ICD-10-CM | POA: Diagnosis not present

## 2022-11-13 DIAGNOSIS — R0789 Other chest pain: Secondary | ICD-10-CM | POA: Diagnosis not present

## 2022-11-13 LAB — RESP PANEL BY RT-PCR (RSV, FLU A&B, COVID)  RVPGX2
Influenza A by PCR: POSITIVE — AB
Influenza B by PCR: NEGATIVE
Resp Syncytial Virus by PCR: NEGATIVE
SARS Coronavirus 2 by RT PCR: NEGATIVE

## 2022-11-13 LAB — BASIC METABOLIC PANEL
Anion gap: 6 (ref 5–15)
BUN: 8 mg/dL (ref 6–20)
CO2: 28 mmol/L (ref 22–32)
Calcium: 8.6 mg/dL — ABNORMAL LOW (ref 8.9–10.3)
Chloride: 104 mmol/L (ref 98–111)
Creatinine, Ser: 0.62 mg/dL (ref 0.44–1.00)
GFR, Estimated: >60 mL/min (ref >60)
Glucose, Bld: 115 mg/dL — ABNORMAL HIGH (ref 70–99)
Potassium: 3.4 mmol/L — ABNORMAL LOW (ref 3.5–5.1)
Sodium: 138 mmol/L (ref 135–145)

## 2022-11-13 LAB — CBC
HCT: 34.5 % — ABNORMAL LOW (ref 36.0–46.0)
Hemoglobin: 10.8 g/dL — ABNORMAL LOW (ref 12.0–15.0)
MCH: 23.7 pg — ABNORMAL LOW (ref 26.0–34.0)
MCHC: 31.3 g/dL (ref 30.0–36.0)
MCV: 75.7 fL — ABNORMAL LOW (ref 80.0–100.0)
Platelets: 243 10*3/uL (ref 150–400)
RBC: 4.56 MIL/uL (ref 3.87–5.11)
RDW: 13.3 % (ref 11.5–15.5)
WBC: 3.4 10*3/uL — ABNORMAL LOW (ref 4.0–10.5)
nRBC: 0 % (ref 0.0–0.2)

## 2022-11-13 LAB — TROPONIN I (HIGH SENSITIVITY): Troponin I (High Sensitivity): <2 ng/L (ref <18)

## 2022-11-13 LAB — PREGNANCY, URINE: Preg Test, Ur: NEGATIVE

## 2022-11-13 MED ORDER — ACETAMINOPHEN 325 MG PO TABS
650.0000 mg | ORAL_TABLET | Freq: Once | ORAL | Status: AC
  Administered 2022-11-13: 650 mg via ORAL
  Filled 2022-11-13: qty 2

## 2022-11-13 MED ORDER — IBUPROFEN 400 MG PO TABS
600.0000 mg | ORAL_TABLET | Freq: Once | ORAL | Status: AC
  Administered 2022-11-13: 600 mg via ORAL
  Filled 2022-11-13: qty 1

## 2022-11-13 NOTE — ED Triage Notes (Signed)
C/o chills, weaknes and cough since Monday; c/o chest tightness and left arm weakness as well.

## 2022-11-13 NOTE — Discharge Instructions (Signed)
You were seen in the emergency department for your chest pain, cough and bodyaches.  You tested positive for the flu is likely causing all your symptoms.  Your workup showed no signs of heart attack or inflammation of your heart and no signs of anemia or severe dehydration.  You are outside the window of treatment with Tamiflu and you can continue symptomatic treatment with Tylenol and Motrin as needed for fevers and bodyaches and Mucinex as needed for cough or congestion.  You should follow-up with your primary doctor in the next few days to have your symptoms rechecked.  You should return to the emergency department for significantly worsening chest pain, worsening shortness of breath, if you pass out or if you have any other new or concerning symptoms.

## 2022-11-13 NOTE — Telephone Encounter (Signed)
Chief Complaint: chills , weakness,  Symptoms: left chest pain Frequency: Tuesday Pertinent Negatives: Patient denies fever, rash difficulty Disposition: [] ED /[] Urgent Care (no appt availability in office) / [x] Appointment(In office/virtual)/ []  [facility]/ [] Home Care/ [] Refused Recommended Disposition /[] [facility]/ []  Follow-up with PCP Additional Notes: n/a Reason for Disposition  Dizziness or lightheadedness  Answer Assessment - Initial Assessment Questions 1. DESCRIPTION: "Describe your dizziness."     Feels like going to fall 2. LIGHTHEADED: "Do you feel lightheaded?" (e.g., somewhat faint, woozy, weak upon standing)     Faint and weakness 3. VERTIGO: "Do you feel like either you or the room is spinning or tilting?" (i.e. vertigo)     no 4. SEVERITY: "How bad is it?"  "Do you feel like you are going to faint?" "Can you stand and walk?"   - MILD: Feels slightly dizzy, but walking normally.   - MODERATE: Feels unsteady when walking, but not falling; interferes with normal activities (e.g., school, work).   - SEVERE: Unable to walk without falling, or requires assistance to walk without falling; feels like passing out now.      mild 5. ONSET:  "When did the dizziness begin?"     Monday 6. AGGRAVATING FACTORS: "Does anything make it worse?" (e.g., standing, change in head position)     standing 7. HEART RATE: "Can you tell me your heart rate?" "How many beats in 15 seconds?"  (Note: not all patients can do this)       84 bpm 8. CAUSE: "What do you think is causing the dizziness?"     Unsure  9. RECURRENT SYMPTOM: "Have you had dizziness before?" If Yes, ask: "When was the last time?" "What happened that time?"   N/a 10. OTHER SYMPTOMS: "Do you have any other symptoms?" (e.g., fever, chest pain, vomiting, diarrhea, bleeding)         Mouth bitter , right arm tingling, felt like chest pain (left side), SOB with exertion, severe upper abd pain  11.  PREGNANCY: "Is there any chance you are pregnant?" "When was your last menstrual period?"       N/a  Answer Assessment - Initial Assessment Questions 1. LOCATION: "Where does it hurt?"       Left chest 2. RADIATION: "Does the pain go anywhere else?" (e.g., into neck, jaw, arms, back)     back 3. ONSET: "When did the chest pain begin?" (Minutes, hours or days)      Monday 4. PATTERN: "Does the pain come and go, or has it been constant since it started?"  "Does it get worse with exertion?"      Comes and goes 5. DURATION: "How long does it last" (e.g., seconds, minutes, hours)     constant 6. SEVERITY: "How bad is the pain?"  (e.g., Scale 1-10; mild, moderate, or severe)    - MILD (1-3): doesn't interfere with normal activities     - MODERATE (4-7): interferes with normal activities or awakens from sleep    - SEVERE (8-10): excruciating pain, unable to do any normal activities       mild 7. CARDIAC RISK FACTORS: "Do you have any history of heart problems or risk factors for heart disease?" (e.g., angina, prior heart attack; diabetes, high blood pressure, high cholesterol, smoker, or strong family history of heart disease)     no 8. PULMONARY RISK FACTORS: "Do you have any history of lung disease?"  (e.g., blood clots in lung, asthma, emphysema, birth control pills)  Pulmonary edema 9. CAUSE: "What do you think is causing the chest pain?"     unsure 10. OTHER SYMPTOMS: "Do you have any other symptoms?" (e.g., dizziness, nausea, vomiting, sweating, fever, difficulty breathing, cough)       Dizziness, chills, weakness 11. PREGNANCY: "Is there any chance you are pregnant?" "When was your last menstrual period?"       N/a  Protocols used: Dizziness - Lightheadedness-A-AH, Chest Pain-A-AH

## 2022-11-13 NOTE — ED Provider Notes (Signed)
[facility] EMERGENCY DEPARTMENT Provider Note   CSN: [id number] Arrival date & time: 11/13/22  1035     History  Chief Complaint  Patient presents with   Chest Pain   Cough   Chills    Suzanne Ramirez is a 41 y.o. female.  41 year old female with no significant past medical history presenting to the emergency department with cough and bodyaches since Monday.  She states that she has had associated chest pain that comes and goes some dyspnea on exertion.  She denies any nausea, vomiting or diarrhea.  She states that she has had a mild sore throat.  She denies any known sick contacts.  The history is provided by the patient.  Chest Pain Associated symptoms: cough   Cough Associated symptoms: chest pain        Home Medications Prior to Admission medications   Medication Sig Start Date End Date Taking? Authorizing Provider  acetaminophen (TYLENOL) 500 MG tablet Take 1,000 mg by mouth every 6 (six) hours as needed for mild pain.    [provider]  ferrous sulfate 325 (65 FE) MG tablet Take 1 tablet (325 mg total) by mouth daily. 04/22/15   Marisa Severin, MD  ibuprofen (ADVIL,MOTRIN) 600 MG tablet Take 1 tablet (600 mg total) by mouth every 6 (six) hours. 02/06/16   Meisinger, Tawanna Cooler, MD  NIFEdipine (PROCARDIA XL) 30 MG 24 hr tablet Take 1 tablet (30 mg total) by mouth 2 (two) times daily. 02/10/16   Alvira Monday, MD      Allergies    Patient has no known allergies.    Review of Systems   Review of Systems  Respiratory:  Positive for cough.   Cardiovascular:  Positive for chest pain.    Physical Exam Updated Vital Signs BP 114/68   Pulse 77   Temp 98 F (36.7 C) (Oral)   Resp 17   Ht 5\' 5"  (1.651 m)   Wt 77.6 kg   SpO2 100%   BMI 28.46 kg/m  Physical Exam Vitals and nursing note reviewed.  Constitutional:      General: She is not in acute distress.    Appearance: She is well-developed.  HENT:     Head: Normocephalic and atraumatic.      Mouth/Throat:     Mouth: Mucous membranes are moist. No oral lesions.     Pharynx: No pharyngeal swelling or posterior oropharyngeal erythema.  Eyes:     Extraocular Movements: Extraocular movements intact.  Cardiovascular:     Rate and Rhythm: Normal rate and regular rhythm.     Pulses:          Radial pulses are 2+ on the right side and 2+ on the left side.     Heart sounds: Normal heart sounds.  Pulmonary:     Effort: Pulmonary effort is normal.     Breath sounds: Normal breath sounds.  Chest:     Chest wall: No tenderness.  Abdominal:     Palpations: Abdomen is soft.     Tenderness: There is no abdominal tenderness.  Musculoskeletal:        General: Normal range of motion.     Cervical back: Normal range of motion and neck supple.     Right lower leg: No edema.     Left lower leg: No edema.  Skin:    General: Skin is warm and dry.  Neurological:     General: No focal deficit present.     Mental Status:  She is alert and oriented to person, place, and time.     ED Results / Procedures / Treatments   Labs (all labs ordered are listed, but only abnormal results are displayed) Labs Reviewed  RESP PANEL BY RT-PCR (RSV, FLU A&B, COVID)  RVPGX2 - Abnormal; Notable for the following components:      Result Value   Influenza A by PCR POSITIVE (*)    All other components within normal limits  BASIC METABOLIC PANEL - Abnormal; Notable for the following components:   Potassium 3.4 (*)    Glucose, Bld 115 (*)    Calcium 8.6 (*)    All other components within normal limits  CBC - Abnormal; Notable for the following components:   WBC 3.4 (*)    Hemoglobin 10.8 (*)    HCT 34.5 (*)    MCV 75.7 (*)    MCH 23.7 (*)    All other components within normal limits  PREGNANCY, URINE  TROPONIN I (HIGH SENSITIVITY)    EKG EKG Interpretation  Date/Time:  Friday November 13 2022 11:06:47 EST Ventricular Rate:  81 PR Interval:  119 QRS Duration: 91 QT Interval:  381 QTC  Calculation: 443 R Axis:   67 Text Interpretation: Sinus rhythm Borderline short PR interval Consider right atrial enlargement Abnormal R-wave progression, early transition ST elevation, consider inferior injury No significant change since last tracing Confirmed by Elayne Snare ([id number]) on 11/13/2022 11:57:56 AM  Radiology DG Chest 2 View  Result Date: 11/13/2022 CLINICAL DATA:  chest tightness and left arm weakness EXAM: CHEST - 2 VIEW COMPARISON:  Radiograph 08/26/2016 FINDINGS: Unchanged cardiomediastinal silhouette. There is no focal airspace consolidation. There is no pleural effusion or evidence of pneumothorax. There is no acute osseous abnormality. Right upper quadrant surgical clips. IMPRESSION: No evidence of acute cardiopulmonary disease. Electronically Signed   By: Caprice Renshaw M.D.   On: 11/13/2022 11:26    Procedures Procedures    Medications Ordered in ED Medications  ibuprofen (ADVIL) tablet 600 mg (600 mg Oral Given 11/13/22 1221)  acetaminophen (TYLENOL) tablet 650 mg (650 mg Oral Given 11/13/22 1221)    ED Course/ Medical Decision Making/ A&P                           Medical Decision Making This patient presents to the ED with chief complaint(s) of body aches, cough with no pertinent past medical history which further complicates the presenting complaint. The complaint involves an extensive differential diagnosis and also carries with it a high risk of complications and morbidity.    The differential diagnosis includes viral syndrome, pneumonia, pericarditis, myocarditis, electrolyte abnormality  Additional history obtained: Additional history obtained from spouse Records reviewed N/A  ED Course and Reassessment: Patient was initially evaluated in triage and had labs, EKG and chest x-ray performed.  Labs are significant for positive influenza A test.  Potassium was mildly low.  Troponin was negative and EKG is without acute ischemic changes or signs of  pericarditis.  Chest x-ray negative for acute disease.  Patient's symptoms likely secondary to her viral syndrome because she is outside the window of Tamiflu treatment was recommended continued symptomatic treatment.  She is stable for discharge home and was given strict return precautions.  Independent labs interpretation:  The following labs were independently interpreted: Flu a positive  Independent visualization of imaging: - I independently visualized the following imaging with scope of interpretation limited to determining acute life threatening conditions  related to emergency care: Chest x-ray, which revealed no acute disease  Consultation: - Consulted or discussed management/test interpretation w/ external professional: N/A  Consideration for admission or further workup: Patient has no emergent conditions requiring admission or further work-up at this time and is stable for discharge home with primary care follow-up  Social Determinants of health:N/A    Amount and/or Complexity of Data Reviewed Labs: ordered. Radiology: ordered.  Risk OTC drugs.          Final Clinical Impression(s) / ED Diagnoses Final diagnoses:  Influenza A    Rx / DC Orders ED Discharge Orders     None         Rexford Maus, DO 11/13/22 1308

## 2023-10-06 ENCOUNTER — Other Ambulatory Visit: Payer: Self-pay

## 2023-10-06 ENCOUNTER — Emergency Department (HOSPITAL_BASED_OUTPATIENT_CLINIC_OR_DEPARTMENT_OTHER)
Admission: EM | Admit: 2023-10-06 | Discharge: 2023-10-06 | Disposition: A | Discharge status: Home / Self Care | Payer: Medicaid Other

## 2023-10-06 ENCOUNTER — Encounter (HOSPITAL_BASED_OUTPATIENT_CLINIC_OR_DEPARTMENT_OTHER): Payer: Self-pay

## 2023-10-06 ENCOUNTER — Emergency Department (HOSPITAL_BASED_OUTPATIENT_CLINIC_OR_DEPARTMENT_OTHER): Payer: Medicaid Other

## 2023-10-06 DIAGNOSIS — R509 Fever, unspecified: Secondary | ICD-10-CM | POA: Insufficient documentation

## 2023-10-06 DIAGNOSIS — R52 Pain, unspecified: Secondary | ICD-10-CM

## 2023-10-06 DIAGNOSIS — R051 Acute cough: Secondary | ICD-10-CM | POA: Insufficient documentation

## 2023-10-06 DIAGNOSIS — M791 Myalgia, unspecified site: Secondary | ICD-10-CM | POA: Insufficient documentation

## 2023-10-06 DIAGNOSIS — Z20822 Contact with and (suspected) exposure to covid-19: Secondary | ICD-10-CM | POA: Insufficient documentation

## 2023-10-06 DIAGNOSIS — R0981 Nasal congestion: Secondary | ICD-10-CM | POA: Diagnosis not present

## 2023-10-06 DIAGNOSIS — R059 Cough, unspecified: Secondary | ICD-10-CM | POA: Diagnosis present

## 2023-10-06 LAB — CBC
HCT: 35.1 % — ABNORMAL LOW (ref 36.0–46.0)
Hemoglobin: 11 g/dL — ABNORMAL LOW (ref 12.0–15.0)
MCH: 24 pg — ABNORMAL LOW (ref 26.0–34.0)
MCHC: 31.3 g/dL (ref 30.0–36.0)
MCV: 76.5 fL — ABNORMAL LOW (ref 80.0–100.0)
Platelets: 261 10*3/uL (ref 150–400)
RBC: 4.59 MIL/uL (ref 3.87–5.11)
RDW: 13.5 % (ref 11.5–15.5)
WBC: 10.1 10*3/uL (ref 4.0–10.5)
nRBC: 0 % (ref 0.0–0.2)

## 2023-10-06 LAB — BASIC METABOLIC PANEL
Anion gap: 6 (ref 5–15)
BUN: 10 mg/dL (ref 6–20)
CO2: 26 mmol/L (ref 22–32)
Calcium: 9 mg/dL (ref 8.9–10.3)
Chloride: 104 mmol/L (ref 98–111)
Creatinine, Ser: 0.67 mg/dL (ref 0.44–1.00)
GFR, Estimated: >60 mL/min (ref >60)
Glucose, Bld: 114 mg/dL — ABNORMAL HIGH (ref 70–99)
Potassium: 3.5 mmol/L (ref 3.5–5.1)
Sodium: 136 mmol/L (ref 135–145)

## 2023-10-06 LAB — RESP PANEL BY RT-PCR (RSV, FLU A&B, COVID)  RVPGX2
Influenza A by PCR: NEGATIVE
Influenza B by PCR: NEGATIVE
Resp Syncytial Virus by PCR: NEGATIVE
SARS Coronavirus 2 by RT PCR: NEGATIVE

## 2023-10-06 MED ORDER — AMOXICILLIN 500 MG PO CAPS: 500.0000 mg | ORAL_CAPSULE | Freq: Three times a day (TID) | ORAL | 0 refills | Status: AC

## 2023-10-06 MED ORDER — AZITHROMYCIN 250 MG PO TABS: 250.0000 mg | ORAL_TABLET | Freq: Every day | ORAL | 0 refills | Status: AC

## 2023-10-06 MED ORDER — IBUPROFEN 400 MG PO TABS
600.0000 mg | ORAL_TABLET | Freq: Once | ORAL | Status: AC
  Administered 2023-10-06: 600 mg via ORAL
  Filled 2023-10-06: qty 1

## 2023-10-06 MED ORDER — IBUPROFEN 600 MG PO TABS: 600.0000 mg | ORAL_TABLET | Freq: Four times a day (QID) | ORAL | 0 refills | Status: AC | PRN

## 2023-10-06 NOTE — ED Notes (Signed)

## 2023-10-06 NOTE — ED Provider Notes (Signed)
[place] [facility] AT [facility] Provider Note   CSN: [id number] Arrival date & time: 10/06/23  1516     History  Chief Complaint  Patient presents with   Generalized Body Aches    Alvetta LALIA JOHNSTON is a 42 y.o. female.  HPI   42 year old female presents emergency department with complaints of cough, fever/chills, nausea, dizziness since Sunday.  Reports taking over-the-counter DayQuil as well as throat lozenges which did help with symptoms.  Describes cough as productive yellow like sputum.  Denies any known sick contact.  Denies any chest pain, shortness of breath, abdominal pain, nausea, vomiting.  States that since she received her Motrin while in the ED, symptoms of nausea as well as fever have improved.  Past medical history significant for constipation, pulmonary edema  Home Medications Prior to Admission medications   Medication Sig Start Date End Date Taking? Authorizing Provider  amoxicillin (AMOXIL) 500 MG capsule Take 1 capsule (500 mg total) by mouth 3 (three) times daily. 10/06/23  Yes Sherian Maroon A, PA  azithromycin (ZITHROMAX) 250 MG tablet Take 1 tablet (250 mg total) by mouth daily. Take first 2 tablets together, then 1 every day until finished. 10/06/23  Yes Sherian Maroon A, PA  ibuprofen (ADVIL) 600 MG tablet Take 1 tablet (600 mg total) by mouth every 6 (six) hours as needed. 10/06/23  Yes Sherian Maroon A, PA  acetaminophen (TYLENOL) 500 MG tablet Take 1,000 mg by mouth every 6 (six) hours as needed for mild pain.    [provider]  ferrous sulfate 325 (65 FE) MG tablet Take 1 tablet (325 mg total) by mouth daily. 04/22/15   Marisa Severin, MD  NIFEdipine (PROCARDIA XL) 30 MG 24 hr tablet Take 1 tablet (30 mg total) by mouth 2 (two) times daily. 02/10/16   Alvira Monday, MD      Allergies    Patient has no known allergies.    Review of Systems   Review of Systems  All other systems reviewed and are  negative.   Physical Exam Updated Vital Signs BP 112/70 (BP Location: Left Arm)   Pulse 84   Temp 98.7 F (37.1 C) (Oral)   Resp 16   Ht 5\' 4"  (1.626 m)   Wt 80.7 kg   LMP 09/18/2023 (Exact Date)   SpO2 100%   Breastfeeding No   BMI 30.55 kg/m  Physical Exam Vitals and nursing note reviewed.  Constitutional:      General: She is not in acute distress.    Appearance: She is well-developed.  HENT:     Head: Normocephalic and atraumatic.     Right Ear: Tympanic membrane normal.     Left Ear: Tympanic membrane normal.     Nose: Congestion present.     Mouth/Throat:     Mouth: Mucous membranes are moist.     Pharynx: Oropharynx is clear. No oropharyngeal exudate or posterior oropharyngeal erythema.  Eyes:     Conjunctiva/sclera: Conjunctivae normal.  Cardiovascular:     Rate and Rhythm: Normal rate and regular rhythm.     Heart sounds: No murmur heard. Pulmonary:     Effort: Pulmonary effort is normal. No respiratory distress.     Comments: Rales auscultated right lower lung field. Abdominal:     Palpations: Abdomen is soft.     Tenderness: There is no abdominal tenderness.  Musculoskeletal:        General: No swelling.     Cervical back: Neck supple.  Skin:    General: Skin is warm and dry.     Capillary Refill: Capillary refill takes less than 2 seconds.  Neurological:     Mental Status: She is alert.  Psychiatric:        Mood and Affect: Mood normal.     ED Results / Procedures / Treatments   Labs (all labs ordered are listed, but only abnormal results are displayed) Labs Reviewed  BASIC METABOLIC PANEL - Abnormal; Notable for the following components:      Result Value   Glucose, Bld 114 (*)    All other components within normal limits  CBC - Abnormal; Notable for the following components:   Hemoglobin 11.0 (*)    HCT 35.1 (*)    MCV 76.5 (*)    MCH 24.0 (*)    All other components within normal limits  RESP PANEL BY RT-PCR (RSV, FLU A&B, COVID)   RVPGX2    EKG None  Radiology DG Chest 2 View  Result Date: 10/06/2023 CLINICAL DATA:  Productive cough. EXAM: CHEST - 2 VIEW COMPARISON:  November 13, 2022. FINDINGS: The heart size and mediastinal contours are within normal limits. Both lungs are clear. The visualized skeletal structures are unremarkable. IMPRESSION: No active cardiopulmonary disease. Electronically Signed   By: Lupita Raider M.D.   On: 10/06/2023 19:14    Procedures Procedures    Medications Ordered in ED Medications  ibuprofen (ADVIL) tablet 600 mg (600 mg Oral Given 10/06/23 1558)    ED Course/ Medical Decision Making/ A&P                                 Medical Decision Making Amount and/or Complexity of Data Reviewed Labs: ordered. Radiology: ordered.  Risk Prescription drug management.   This patient presents to the ED for concern of cough, body aches, this involves an extensive number of treatment options, and is a complaint that carries with it a high risk of complications and morbidity.  The differential diagnosis includes viral URI, COVID, RSV, influenza, pneumonia, other   Co morbidities that complicate the patient evaluation  See HPI   Additional history obtained:  Additional history obtained from EMR External records from outside source obtained and reviewed including hospital records   Lab Tests:  I Ordered, and personally interpreted labs.  The pertinent results include: No leukocytosis.  Evidence of anemia with a hemoglobin 11.0 which is microcytic in nature.  Platelets within range.  Respiratory viral panel negative.  Negative.  No Electra abnormalities.  No renal dysfunction.   Imaging Studies ordered:  I ordered imaging studies including chest x-ray I independently visualized and interpreted imaging which showed no acute cardiopulmonary abnormalities I agree with the radiologist interpretation   Cardiac Monitoring: / EKG:  The patient was maintained on a cardiac  monitor.  I personally viewed and interpreted the cardiac monitored which showed an underlying rhythm of: Sinus rhythm   Consultations Obtained:  N/a   Problem List / ED Course / Critical interventions / Medication management  Cough I ordered medication including Advil   Reevaluation of the patient after these medicines showed that the patient improved I have reviewed the patients home medicines and have made adjustments as needed   Social Determinants of Health:  Denies tobacco, illicit drug use   Test / Admission - Considered:  Cough Vitals signs significant for initial fever of 102 as well as tachycardia with a  heart rate of 102 of which decreased to within normal range with time elapsed and medicines administered by the emergency department.. Otherwise within normal range and stable throughout visit. Laboratory/imaging studies significant for: See above 42 year old female presents emergency department with complaints of cough, nausea, fever/chills since Sunday.  Has been trying over-the-counter medications without significant improvement.  On exam, patient with very faint appreciable rales right lower lung field.  Workup overall reassuring.  Labs concerning for mild anemia but without leukocytosis.  Chest x-ray without signs of overt pneumonia.  Given clinical concern for possible developing pneumonia, will place patient on empiric antibiotics and recommend further symptomatic therapy as described in AVS.  Follow-up with primary care recommended for reevaluation of symptoms.  Treatment plan discussed at length with patient and she acknowledged understanding was agreeable to said plan.  Patient overall well-appearing, afebrile in no acute distress. Worrisome signs and symptoms were discussed with the patient, and the patient acknowledged understanding to return to the ED if noticed. Patient was stable upon discharge.          Final Clinical Impression(s) / ED Diagnoses Final  diagnoses:  Acute cough  Fever, unspecified fever cause  Body aches    Rx / DC Orders ED Discharge Orders          Ordered    ibuprofen (ADVIL) 600 MG tablet  Every 6 hours PRN        10/06/23 1752    amoxicillin (AMOXIL) 500 MG capsule  3 times daily        10/06/23 1752    azithromycin (ZITHROMAX) 250 MG tablet  Daily        11 /20/24 1752              Peter Garter, [place] 10/06/23 1925    Durwin Glaze, MD 10/07/23 [phone]

## 2023-10-06 NOTE — Discharge Instructions (Addendum)
As discussed, workup today overall reassuring.  You tested negative for COVID, flu, RSV.  Chest x-ray without obvious signs of pneumonia.  Lab work did show slightly low red blood cells.  Given concern for pneumonia on exam, will place you on antibiotics for treatment of this.  Will recommend use of Tylenol/Motrin for treatment of your fever at home.  Recommend close follow-up with your primary care for reassessment of your symptoms.  Please do not hesitate to return to emergency department if there are worrisome signs and symptoms we discussed become apparent.

## 2023-10-06 NOTE — ED Notes (Signed)
Patient transported to X-ray 

## 2023-10-06 NOTE — ED Triage Notes (Signed)
Pt reports body aches, productive cough of yellow phlegm, dizziness/nausea, fever/chills - onset Sunday night. She also had a sore throat but was relieved with throat lozenges. She took Dayquil today at 1045am.

## 2023-12-21 ENCOUNTER — Ambulatory Visit: Payer: Self-pay

## 2023-12-21 ENCOUNTER — Ambulatory Visit (HOSPITAL_COMMUNITY)
Admission: RE | Admit: 2023-12-21 | Discharge: 2023-12-21 | Disposition: A | Discharge status: Home / Self Care | Payer: Medicaid Other | Source: Ambulatory Visit | Attending: Emergency Medicine | Admitting: Emergency Medicine

## 2023-12-21 ENCOUNTER — Encounter (HOSPITAL_COMMUNITY): Payer: Self-pay

## 2023-12-21 VITALS — BP 123/81 | HR 75 | Temp 98.5°F | Resp 18

## 2023-12-21 DIAGNOSIS — M25522 Pain in left elbow: Secondary | ICD-10-CM | POA: Diagnosis not present

## 2023-12-21 DIAGNOSIS — M545 Low back pain, unspecified: Secondary | ICD-10-CM | POA: Diagnosis not present

## 2023-12-21 MED ORDER — CYCLOBENZAPRINE HCL 5 MG PO TABS: 5.0000 mg | ORAL_TABLET | Freq: Three times a day (TID) | ORAL | 0 refills | Status: AC | PRN

## 2023-12-21 NOTE — ED Triage Notes (Signed)
Pt states she has right hip pain and left arm pain (at elbow) X3 days. She took Ibu yesterday nothing today.

## 2023-12-21 NOTE — Discharge Instructions (Signed)
 Take home meds as directed. May use heat or ice to back for comfort. May use lidocaine  patch or biofreeze for pain.  May use Tylenol , ibuprofen  as label directed for pain.  Please follow up with PCP, may need to referral to physical therapy for further back pain management. GO immediately to ER for loss of bowel and bladder,loss of function, saddle numbness, etc.

## 2023-12-21 NOTE — ED Provider Notes (Signed)
 [facility]    CSN: [id number] Arrival date & time: 12/21/23  0857      History   Chief Complaint Chief Complaint  Patient presents with   Hip Pain   Arm Pain    HPI Suzanne Ramirez is a 43 y.o. female.   Suzanne Ramirez, 43 year old female, presents to urgent care for evaluation of right low back pain and left arm elbow pain x 3 days.  Patient states she bent over to pick up something and felt pain, no fall or blunt force trauma.  Patient states she has taken ibuprofen , used over-the-counter IcyHot without relief. Pt is without rash or bruising. Pt denies loss of bowel and bladder,no loss of function, no saddle numbness, is right hand dominant  The history is provided by the patient. No language interpreter was used.    Past Medical History:  Diagnosis Date   Gallstones    Gestational diabetes    diet controlled   Gestational diabetes mellitus (GDM), antepartum    No pertinent past medical history    Normal vaginal delivery    2007 and 2010    Patient Active Problem List   Diagnosis Date Noted   Acute right-sided low back pain without sciatica 12/21/2023   Left elbow pain 12/21/2023   Pulmonary edema 02/10/2016   Preeclampsia in postpartum period 02/10/2016   Gestational diabetes mellitus in third trimester 02/05/2016   Fall 12/02/2015   Normal pregnancy 12/06/2012   SVD (spontaneous vaginal delivery) 12/06/2012   Cholecystolithiasis 03/23/2012   Constipation 03/23/2012    Past Surgical History:  Procedure Laterality Date   CHOLECYSTECTOMY     LAPAROSCOPIC CHOLECYSTECTOMY SINGLE SITE WITH INTRAOPERATIVE CHOLANGIOGRAM N/A 12/04/2014   Procedure: LAPAROSCOPIC CHOLECYSTECTOMY SINGLE SITE WITH INTRAOPERATIVE CHOLANGIOGRAM;  Surgeon: Elspeth Schultze, MD;  Location: [facility];  Service: General;  Laterality: N/A;   NO PAST SURGERIES     WISDOM TOOTH EXTRACTION Bilateral 2013   upper    OB History     Gravida  4   Para  4   Term  4   Preterm  0    AB  0   Living  4      SAB  0   IAB  0   Ectopic  0   Multiple  0   Live Births  4            Home Medications    Prior to Admission medications   Medication Sig Start Date End Date Taking? Authorizing Provider  acetaminophen  (TYLENOL ) 500 MG tablet Take 1,000 mg by mouth every 6 (six) hours as needed for mild pain.   Yes [provider]  cyclobenzaprine  (FLEXERIL ) 5 MG tablet Take 1 tablet (5 mg total) by mouth 3 (three) times daily as needed for up to 5 days for muscle spasms. 12/21/23 12/26/23 Yes Darnelle Derrick, NP  amoxicillin  (AMOXIL ) 500 MG capsule Take 1 capsule (500 mg total) by mouth 3 (three) times daily. 10/06/23   Silver Wonda LABOR, PA  azithromycin  (ZITHROMAX ) 250 MG tablet Take 1 tablet (250 mg total) by mouth daily. Take first 2 tablets together, then 1 every day until finished. 10/06/23   Silver Wonda LABOR, PA  ferrous sulfate  325 (65 FE) MG tablet Take 1 tablet (325 mg total) by mouth daily. 04/22/15   Saul Copping, MD  ibuprofen  (ADVIL ) 600 MG tablet Take 1 tablet (600 mg total) by mouth every 6 (six) hours as needed. 10/06/23   Silver Wonda LABOR, PA  NIFEdipine  (PROCARDIA  XL) 30 MG 24 hr tablet Take 1 tablet (30 mg total) by mouth 2 (two) times daily. 02/10/16   Dreama Longs, MD    Family History Family History  Problem Relation Age of Onset   Other Neg Hx    Alcohol abuse Neg Hx    Arthritis Neg Hx    Asthma Neg Hx    Birth defects Neg Hx    Cancer Neg Hx    COPD Neg Hx    Depression Neg Hx    Diabetes Neg Hx    Drug abuse Neg Hx    Early death Neg Hx    Hearing loss Neg Hx    Heart disease Neg Hx    Hyperlipidemia Neg Hx    Hypertension Neg Hx    Kidney disease Neg Hx    Learning disabilities Neg Hx    Mental illness Neg Hx    Mental retardation Neg Hx    Miscarriages / Stillbirths Neg Hx    Stroke Neg Hx    Vision loss Neg Hx    Varicose Veins Neg Hx     Social History Social History   Tobacco Use   Smoking status:  Never   Smokeless tobacco: Never  Vaping Use   Vaping status: Never Used  Substance Use Topics   Alcohol use: No   Drug use: No     Allergies   Patient has no known allergies.   Review of Systems Review of Systems  Constitutional:  Negative for fever.  Genitourinary:  Negative for dysuria.  Musculoskeletal:  Positive for back pain and myalgias.  All other systems reviewed and are negative.    Physical Exam Triage Vital Signs ED Triage Vitals  Encounter Vitals Group     BP 12/21/23 1007 123/81     Systolic BP Percentile --      Diastolic BP Percentile --      Pulse Rate 12/21/23 1007 75     Resp 12/21/23 1007 18     Temp 12/21/23 1007 98.5 F (36.9 C)     Temp Source 12/21/23 1007 Oral     SpO2 12/21/23 1007 99 %     Weight --      Height --      Head Circumference --      Peak Flow --      Pain Score 12/21/23 1005 8     Pain Loc --      Pain Education --      Exclude from Growth Chart --    No data found.  Updated Vital Signs BP 123/81 (BP Location: Right Arm)   Pulse 75   Temp 98.5 F (36.9 C) (Oral)   Resp 18   LMP 12/07/2023 (Exact Date)   SpO2 99%   Visual Acuity Right Eye Distance:   Left Eye Distance:   Bilateral Distance:    Right Eye Near:   Left Eye Near:    Bilateral Near:     Physical Exam Vitals and nursing note reviewed.  Constitutional:      Appearance: Normal appearance. She is well-developed and well-groomed.  HENT:     Head: Normocephalic.  Cardiovascular:     Rate and Rhythm: Normal rate and regular rhythm.     Pulses: Normal pulses.          Radial pulses are 2+ on the left side.       Dorsalis pedis pulses are 2+ on the right side and 2+ on  the left side.     Heart sounds: Normal heart sounds.  Pulmonary:     Effort: Pulmonary effort is normal.  Musculoskeletal:     Left elbow: No swelling, deformity, effusion or lacerations. Normal range of motion. Tenderness present.       Arms:     Lumbar back: Spasms and  tenderness present. Normal range of motion. Negative right straight leg raise test and negative left straight leg raise test.       Back:  Skin:    General: Skin is warm.     Capillary Refill: Capillary refill takes less than 2 seconds.     Findings: No rash.  Psychiatric:        Behavior: Behavior is cooperative.      UC Treatments / Results  Labs (all labs ordered are listed, but only abnormal results are displayed) Labs Reviewed - No data to display  EKG   Radiology No results found.  Procedures Procedures (including critical care time)  Medications Ordered in UC Medications - No data to display  Initial Impression / Assessment and Plan / UC Course  I have reviewed the triage vital signs and the nursing notes.  Pertinent labs & imaging results that were available during my care of the patient were reviewed by me and considered in my medical decision making (see chart for details).    Discussed exam findings and plan of care with patient, scripted Flexeril  , discussed over-the-counter medications and interventions , strict go to ER precautions given.   Patient verbalized understanding to this provider.  Acute low back pain without sciatica, left elbow pain, muscle strain, muscle spasm Final Clinical Impressions(s) / UC Diagnoses   Final diagnoses:  Acute right-sided low back pain without sciatica  Left elbow pain     Discharge Instructions      Take home meds as directed. May use heat or ice to back for comfort. May use lidocaine  patch or biofreeze for pain.  May use Tylenol , ibuprofen  as label directed for pain.  Please follow up with PCP, may need to referral to physical therapy for further back pain management. GO immediately to ER for loss of bowel and bladder,loss of function, saddle numbness, etc.      ED Prescriptions     Medication Sig Dispense Auth. Provider   cyclobenzaprine  (FLEXERIL ) 5 MG tablet Take 1 tablet (5 mg total) by mouth 3 (three)  times daily as needed for up to 5 days for muscle spasms. 15 tablet Francies Inch, Rilla, NP      PDMP not reviewed this encounter.   Aminta Rilla, NP 12/21/23 1052

## 2024-07-31 ENCOUNTER — Other Ambulatory Visit: Payer: Self-pay | Admitting: Internal Medicine

## 2024-07-31 DIAGNOSIS — E042 Nontoxic multinodular goiter: Secondary | ICD-10-CM

## 2024-07-31 DIAGNOSIS — E0590 Thyrotoxicosis, unspecified without thyrotoxic crisis or storm: Secondary | ICD-10-CM

## 2024-07-31 DIAGNOSIS — R946 Abnormal results of thyroid function studies: Secondary | ICD-10-CM

## 2024-08-02 ENCOUNTER — Ambulatory Visit
Admission: RE | Admit: 2024-08-02 | Discharge: 2024-08-02 | Disposition: A | Discharge status: Home / Self Care | Source: Ambulatory Visit | Attending: Internal Medicine | Admitting: Internal Medicine

## 2024-08-02 DIAGNOSIS — E042 Nontoxic multinodular goiter: Secondary | ICD-10-CM

## 2024-08-02 DIAGNOSIS — R946 Abnormal results of thyroid function studies: Secondary | ICD-10-CM

## 2024-08-02 DIAGNOSIS — E0590 Thyrotoxicosis, unspecified without thyrotoxic crisis or storm: Secondary | ICD-10-CM

## 2024-08-14 ENCOUNTER — Other Ambulatory Visit: Payer: Self-pay | Admitting: Internal Medicine

## 2024-08-14 DIAGNOSIS — E042 Nontoxic multinodular goiter: Secondary | ICD-10-CM

## 2024-09-08 ENCOUNTER — Inpatient Hospital Stay: Admission: RE | Admit: 2024-09-08 | Source: Ambulatory Visit

## 2024-10-04 ENCOUNTER — Inpatient Hospital Stay: Admission: RE | Admit: 2024-10-04 | Source: Ambulatory Visit

## 2024-11-03 ENCOUNTER — Other Ambulatory Visit

## 2024-11-03 ENCOUNTER — Inpatient Hospital Stay: Admission: RE | Admit: 2024-11-03

## 2024-11-03 ENCOUNTER — Other Ambulatory Visit (HOSPITAL_COMMUNITY)
Admission: RE | Admit: 2024-11-03 | Discharge: 2024-11-03 | Disposition: A | Discharge status: Home / Self Care | Source: Ambulatory Visit | Attending: Interventional Radiology | Admitting: Interventional Radiology

## 2024-11-03 DIAGNOSIS — E042 Nontoxic multinodular goiter: Secondary | ICD-10-CM

## 2024-11-06 LAB — CYTOLOGY - NON PAP
